# Patient Record
Sex: Female | Born: 1980 | Race: Black or African American | Hispanic: No | Marital: Married | Smoking: Never smoker
Health system: Southern US, Community
[De-identification: ages and names within clinical notes are randomized; demographics above are authoritative.]

## PROBLEM LIST (undated history)

## (undated) ENCOUNTER — Inpatient Hospital Stay (HOSPITAL_COMMUNITY): Payer: Self-pay

## (undated) DIAGNOSIS — Z9884 Bariatric surgery status: Secondary | ICD-10-CM

## (undated) DIAGNOSIS — E559 Vitamin D deficiency, unspecified: Secondary | ICD-10-CM

## (undated) DIAGNOSIS — I10 Essential (primary) hypertension: Secondary | ICD-10-CM

## (undated) DIAGNOSIS — E079 Disorder of thyroid, unspecified: Secondary | ICD-10-CM

## (undated) DIAGNOSIS — M25569 Pain in unspecified knee: Secondary | ICD-10-CM

## (undated) DIAGNOSIS — N946 Dysmenorrhea, unspecified: Secondary | ICD-10-CM

## (undated) DIAGNOSIS — E041 Nontoxic single thyroid nodule: Secondary | ICD-10-CM

## (undated) DIAGNOSIS — O09529 Supervision of elderly multigravida, unspecified trimester: Secondary | ICD-10-CM

## (undated) DIAGNOSIS — E669 Obesity, unspecified: Secondary | ICD-10-CM

## (undated) DIAGNOSIS — Z98891 History of uterine scar from previous surgery: Secondary | ICD-10-CM

## (undated) HISTORY — DX: Obesity, unspecified: E66.9

## (undated) HISTORY — DX: Supervision of elderly multigravida, unspecified trimester: O09.529

## (undated) HISTORY — DX: Pain in unspecified knee: M25.569

## (undated) HISTORY — DX: Vitamin D deficiency, unspecified: E55.9

## (undated) HISTORY — PX: LAPAROSCOPIC GASTRECTOMY: SHX5894

## (undated) HISTORY — DX: Bariatric surgery status: Z98.84

## (undated) HISTORY — DX: Nontoxic single thyroid nodule: E04.1

## (undated) HISTORY — DX: Dysmenorrhea, unspecified: N94.6

---

## 2006-06-06 ENCOUNTER — Encounter: Admission: RE | Admit: 2006-06-06 | Discharge: 2006-07-11 | Payer: Self-pay | Admitting: Sports Medicine

## 2007-04-24 ENCOUNTER — Emergency Department (HOSPITAL_COMMUNITY): Admission: EM | Admit: 2007-04-24 | Discharge: 2007-04-24 | Payer: Self-pay | Admitting: Family Medicine

## 2007-07-27 ENCOUNTER — Emergency Department (HOSPITAL_COMMUNITY): Admission: EM | Admit: 2007-07-27 | Discharge: 2007-07-27 | Payer: Self-pay | Admitting: Family Medicine

## 2008-11-07 ENCOUNTER — Emergency Department: Payer: Self-pay | Admitting: Unknown Physician Specialty

## 2010-09-12 ENCOUNTER — Emergency Department: Payer: Self-pay | Admitting: Emergency Medicine

## 2011-05-25 ENCOUNTER — Emergency Department: Payer: Self-pay | Admitting: Emergency Medicine

## 2011-08-31 LAB — STREP A DNA PROBE: Group A Strep Probe: NEGATIVE

## 2012-10-11 ENCOUNTER — Emergency Department: Payer: Self-pay | Admitting: Emergency Medicine

## 2012-11-05 LAB — HEMOGLOBIN A1C: Hgb A1c MFr Bld: 5.7 % (ref 4.0–6.0)

## 2013-01-30 ENCOUNTER — Ambulatory Visit: Payer: Self-pay | Admitting: Internal Medicine

## 2013-08-18 ENCOUNTER — Ambulatory Visit: Payer: Self-pay | Admitting: Family Medicine

## 2013-08-24 ENCOUNTER — Emergency Department (HOSPITAL_COMMUNITY): Payer: Self-pay

## 2013-08-24 ENCOUNTER — Emergency Department (HOSPITAL_COMMUNITY)
Admission: EM | Admit: 2013-08-24 | Discharge: 2013-08-24 | Disposition: A | Discharge status: Home / Self Care | Payer: Self-pay | Attending: Emergency Medicine | Admitting: Emergency Medicine

## 2013-08-24 ENCOUNTER — Encounter (HOSPITAL_COMMUNITY): Payer: Self-pay | Admitting: *Deleted

## 2013-08-24 DIAGNOSIS — E860 Dehydration: Secondary | ICD-10-CM | POA: Insufficient documentation

## 2013-08-24 DIAGNOSIS — R61 Generalized hyperhidrosis: Secondary | ICD-10-CM | POA: Insufficient documentation

## 2013-08-24 DIAGNOSIS — R42 Dizziness and giddiness: Secondary | ICD-10-CM | POA: Insufficient documentation

## 2013-08-24 DIAGNOSIS — I10 Essential (primary) hypertension: Secondary | ICD-10-CM | POA: Insufficient documentation

## 2013-08-24 DIAGNOSIS — Z79899 Other long term (current) drug therapy: Secondary | ICD-10-CM | POA: Insufficient documentation

## 2013-08-24 DIAGNOSIS — E079 Disorder of thyroid, unspecified: Secondary | ICD-10-CM | POA: Insufficient documentation

## 2013-08-24 DIAGNOSIS — R55 Syncope and collapse: Secondary | ICD-10-CM | POA: Insufficient documentation

## 2013-08-24 DIAGNOSIS — E876 Hypokalemia: Secondary | ICD-10-CM | POA: Insufficient documentation

## 2013-08-24 HISTORY — DX: Disorder of thyroid, unspecified: E07.9

## 2013-08-24 HISTORY — DX: Essential (primary) hypertension: I10

## 2013-08-24 LAB — CBC WITH DIFFERENTIAL/PLATELET
Basophils Absolute: 0 10*3/uL (ref 0.0–0.1)
Eosinophils Relative: 3 % (ref 0–5)
HCT: 36.9 % (ref 36.0–46.0)
Hemoglobin: 12 g/dL (ref 12.0–15.0)
Lymphocytes Relative: 28 % (ref 12–46)
Lymphs Abs: 1.8 10*3/uL (ref 0.7–4.0)
MCV: 85.6 fL (ref 78.0–100.0)
Monocytes Relative: 8 % (ref 3–12)
Neutro Abs: 3.9 10*3/uL (ref 1.7–7.7)
Platelets: 243 10*3/uL (ref 150–400)
RBC: 4.31 MIL/uL (ref 3.87–5.11)
RDW: 13.4 % (ref 11.5–15.5)
WBC: 6.4 10*3/uL (ref 4.0–10.5)

## 2013-08-24 LAB — URINALYSIS, ROUTINE W REFLEX MICROSCOPIC
Bilirubin Urine: NEGATIVE
Glucose, UA: NEGATIVE mg/dL
Hgb urine dipstick: NEGATIVE
Ketones, ur: NEGATIVE mg/dL
Leukocytes, UA: NEGATIVE
Nitrite: NEGATIVE
Specific Gravity, Urine: 1.011 (ref 1.005–1.030)
pH: 7 (ref 5.0–8.0)

## 2013-08-24 LAB — BASIC METABOLIC PANEL
CO2: 30 mEq/L (ref 19–32)
Chloride: 100 mEq/L (ref 96–112)
Creatinine, Ser: 1.23 mg/dL — ABNORMAL HIGH (ref 0.50–1.10)
GFR calc Af Amer: 66 mL/min — ABNORMAL LOW (ref >90)
Potassium: 2.7 mEq/L — CL (ref 3.5–5.1)
Sodium: 141 mEq/L (ref 135–145)

## 2013-08-24 LAB — POCT I-STAT, CHEM 8
BUN: 18 mg/dL (ref 6–23)
Chloride: 96 mEq/L (ref 96–112)
Creatinine, Ser: 1.6 mg/dL — ABNORMAL HIGH (ref 0.50–1.10)
HCT: 39 % (ref 36.0–46.0)
Potassium: 2.7 mEq/L — CL (ref 3.5–5.1)
Sodium: 140 mEq/L (ref 135–145)
TCO2: 28 mmol/L (ref 0–100)

## 2013-08-24 MED ORDER — SODIUM CHLORIDE 0.9 % IV BOLUS (SEPSIS)
1000.0000 mL | Freq: Once | INTRAVENOUS | Status: AC
  Administered 2013-08-24: 1000 mL via INTRAVENOUS

## 2013-08-24 MED ORDER — POTASSIUM CHLORIDE 10 MEQ/100ML IV SOLN
10.0000 meq | Freq: Once | INTRAVENOUS | Status: AC
  Administered 2013-08-24: 10 meq via INTRAVENOUS
  Filled 2013-08-24: qty 100

## 2013-08-24 MED ORDER — POTASSIUM CHLORIDE CRYS ER 20 MEQ PO TBCR
60.0000 meq | EXTENDED_RELEASE_TABLET | Freq: Once | ORAL | Status: AC
  Administered 2013-08-24: 60 meq via ORAL
  Filled 2013-08-24: qty 3

## 2013-08-24 NOTE — ED Notes (Signed)
Pt states she has had a recent change in BP medication.  Pt states she started to get a little dizzy and "went to sleep for a second."  No seizure activity per EMS

## 2013-08-24 NOTE — ED Provider Notes (Signed)
CSN: [id number]     Arrival date & time 08/24/13  2014 History   First MD Initiated Contact with Patient 08/24/13 2015     Chief Complaint  Patient presents with  . Loss of Consciousness   (Consider location/radiation/quality/duration/timing/severity/associated sxs/prior Treatment) Patient is a 32 y.o. female presenting with syncope. The history is provided by the patient.  Loss of Consciousness Episode history:  Single Most recent episode:  Today Timing:  Rare Progression:  Resolved Chronicity:  New Context: dehydration and standing up   Witnessed: yes   Relieved by:  Sitting up Worsened by:  Posture Ineffective treatments:  None tried Associated symptoms: diaphoresis and dizziness (light-headed)   Associated symptoms: no anxiety, no chest pain, no fever, no focal weakness, no headaches, no nausea, no palpitations, no seizures, no shortness of breath, no visual change, no vomiting and no weakness   Risk factors: no vascular disease     Past Medical History  Diagnosis Date  . Hypertension   . Thyroid disease    History reviewed. No pertinent past surgical history. No family history on file. History  Substance Use Topics  . Smoking status: Never Smoker   . Smokeless tobacco: Not on file  . Alcohol Use: No   OB History   Grav Para Term Preterm Abortions TAB SAB Ect Mult Living                 Review of Systems  Constitutional: Positive for diaphoresis. Negative for fever and activity change.  HENT: Negative for congestion and rhinorrhea.   Respiratory: Negative for chest tightness and shortness of breath.   Cardiovascular: Positive for syncope. Negative for chest pain and palpitations.  Gastrointestinal: Negative for nausea, vomiting, diarrhea and constipation.  Genitourinary: Negative for dysuria and difficulty urinating.  Musculoskeletal: Negative for myalgias and arthralgias.  Skin: Negative for color change, pallor, rash and wound.  Neurological: Positive for  dizziness (light-headed) and syncope. Negative for tremors, focal weakness, seizures, speech difficulty, weakness, light-headedness and headaches.  All other systems reviewed and are negative.    Allergies  Review of patient's allergies indicates no known allergies.  Home Medications   Current Outpatient Rx  Name  Route  Sig  Dispense  Refill  . amLODipine (NORVASC) 10 MG tablet   Oral   Take 10 mg by mouth daily.         . chlorthalidone (HYGROTON) 50 MG tablet   Oral   Take 50 mg by mouth daily.          BP 118/68  Pulse 67  Temp(Src) 97.6 F (36.4 C) (Oral)  Resp 20  SpO2 100%  LMP 08/12/2013 Physical Exam  Nursing note and vitals reviewed. Constitutional: She is oriented to person, place, and time. She appears well-developed and well-nourished. No distress.  Pleasant, Well-appearing female in no apparent distress  HENT:  Head: Normocephalic and atraumatic.  Mouth/Throat: Oropharynx is clear and moist. No oropharyngeal exudate.  Dry mucous membranes  Eyes: Conjunctivae and EOM are normal. Pupils are equal, round, and reactive to light.  Neck: Normal range of motion. Neck supple.  Cardiovascular: Normal rate, regular rhythm and normal heart sounds.  Exam reveals no gallop and no friction rub.   No murmur heard. Pulmonary/Chest: Effort normal and breath sounds normal. No respiratory distress. She has no wheezes. She has no rales. She exhibits no tenderness.  Abdominal: Soft. She exhibits no distension. There is no tenderness.  Musculoskeletal: Normal range of motion. She exhibits no edema and no  tenderness.  Lymphadenopathy:    She has no cervical adenopathy.  Neurological: She is alert and oriented to person, place, and time. She has normal strength. No cranial nerve deficit or sensory deficit. Coordination and gait normal. GCS eye subscore is 4. GCS verbal subscore is 5. GCS motor subscore is 6.  Reflex Scores:      Bicep reflexes are 2+ on the right side and 2+  on the left side.      Patellar reflexes are 2+ on the right side and 2+ on the left side. Skin: Skin is warm and dry. No rash noted. She is not diaphoretic.  Psychiatric: She has a normal mood and affect. Her behavior is normal. Judgment and thought content normal.    ED Course  Procedures (including critical care time) Labs Review Labs Reviewed  BASIC METABOLIC PANEL - Abnormal; Notable for the following:    Potassium 2.7 (*)    Glucose, Bld 107 (*)    Creatinine, Ser 1.23 (*)    GFR calc non Af Amer 57 (*)    GFR calc Af Amer 66 (*)    All other components within normal limits  POCT I-STAT, CHEM 8 - Abnormal; Notable for the following:    Potassium 2.7 (*)    Creatinine, Ser 1.60 (*)    Glucose, Bld 111 (*)    All other components within normal limits  CBC WITH DIFFERENTIAL  URINALYSIS, ROUTINE W REFLEX MICROSCOPIC   Imaging Review Dg Chest 2 View  08/24/2013   CLINICAL DATA:  Syncope today, recently started new blood pressure medication  EXAM: CHEST  2 VIEW  COMPARISON:  None.  FINDINGS: The heart size and mediastinal contours are within normal limits. Both lungs are clear. The visualized skeletal structures are unremarkable.  IMPRESSION: No active cardiopulmonary disease.   Electronically Signed   By: Esperanza Heir M.D.   On: 08/24/2013 21:16    MDM   1. Syncope   2. Hypokalemia      Date: 08/24/2013  Rate: 67  Rhythm: normal sinus rhythm  QRS Axis: normal  Intervals: normal  ST/T Wave abnormalities: normal  Conduction Disutrbances:none  Narrative Interpretation:   Old EKG Reviewed: none available  The patient is a 32 year old female with a history of hypertension as well as hyperthyroidism who presents with syncope. She was recently started on Norvasc by her PCP about one week ago. She states that the last 3-4 days she has had lightheadedness, mostly in the afternoons, which resolved with sitting. This evening, while ambulating, she began to feel lightheaded and  dizzy as well as diaphoretic. She tried to close her eyes maybe go away, and she proceeded to syncopize. Did not hit her head on the way down. Did not seize and has been well since.  On arrival, afebrile and hemodynamically stable. EKG shows no signs of arrythmia. CXR shows no consolidation, effusion, pneumothorax, or widened mediastinum. No leukocytosis or anemia. BMP shows hypokalemia at 2.7 but no EKG changes. Likely due to recent juicing. Feel syncope is most likely not related to hypokalemia and instead related to blood pressure medication adjustments and orthostasis. After hydration, no longer light headed and able to ambulate independently. Potassium replaced with 10 mE IV and 60 mE PO. Will have patient follow up with PCP in two days for repeat potassium check. Stable for d/c.  Discussed with the patient return precautions and need for follow up with PCP. Patient voiced understanding. Stable for d/c. This patient was discussed with my  attending, Dr. Jeraldine Loots.  Dorna Leitz, MD 08/25/13 [phone]

## 2013-08-25 NOTE — ED Provider Notes (Signed)
This patient was seen in conjunction with the Resident Physician, Dr. Durward Fortes.  I have seen the relevant studies (and ECG as performed) and agree with the interpretation.  The documentation is accurate, and reflects my interpretation of the encounter.    Gerhard Munch, MD 08/25/13 [phone]

## 2013-10-30 LAB — HM PAP SMEAR: HM Pap smear: NORMAL

## 2014-03-14 ENCOUNTER — Emergency Department: Payer: Self-pay | Admitting: Emergency Medicine

## 2014-03-16 ENCOUNTER — Emergency Department: Payer: Self-pay | Admitting: Emergency Medicine

## 2014-03-16 LAB — CBC WITH DIFFERENTIAL/PLATELET
BASOS ABS: 0.1 10*3/uL (ref 0.0–0.1)
Basophil %: 1.4 %
EOS PCT: 5.2 %
Eosinophil #: 0.2 10*3/uL (ref 0.0–0.7)
HCT: 41 % (ref 35.0–47.0)
HGB: 13.3 g/dL (ref 12.0–16.0)
Lymphocyte #: 1.2 10*3/uL (ref 1.0–3.6)
Lymphocyte %: 31.6 %
MCH: 28.7 pg (ref 26.0–34.0)
MCHC: 32.4 g/dL (ref 32.0–36.0)
MCV: 89 fL (ref 80–100)
MONO ABS: 0.3 x10 3/mm (ref 0.2–0.9)
Monocyte %: 8.1 %
Neutrophil #: 2 10*3/uL (ref 1.4–6.5)
Neutrophil %: 53.7 %
PLATELETS: 257 10*3/uL (ref 150–440)
RBC: 4.62 10*6/uL (ref 3.80–5.20)
RDW: 14.1 % (ref 11.5–14.5)
WBC: 3.8 10*3/uL (ref 3.6–11.0)

## 2014-03-16 LAB — COMPREHENSIVE METABOLIC PANEL
ALK PHOS: 60 U/L
Albumin: 3.9 g/dL (ref 3.4–5.0)
Anion Gap: 6 — ABNORMAL LOW (ref 7–16)
BUN: 11 mg/dL (ref 7–18)
Bilirubin,Total: 0.6 mg/dL (ref 0.2–1.0)
Calcium, Total: 9.2 mg/dL (ref 8.5–10.1)
Chloride: 103 mmol/L (ref 98–107)
Co2: 31 mmol/L (ref 21–32)
Creatinine: 1.07 mg/dL (ref 0.60–1.30)
EGFR (African American): >60
EGFR (Non-African Amer.): >60
Glucose: 73 mg/dL (ref 65–99)
OSMOLALITY: 277 (ref 275–301)
Potassium: 3.7 mmol/L (ref 3.5–5.1)
SGOT(AST): 12 U/L — ABNORMAL LOW (ref 15–37)
SGPT (ALT): 15 U/L (ref 12–78)
Sodium: 140 mmol/L (ref 136–145)
Total Protein: 7.5 g/dL (ref 6.4–8.2)

## 2014-03-16 LAB — LIPASE, BLOOD: Lipase: 162 U/L (ref 73–393)

## 2014-06-23 ENCOUNTER — Emergency Department: Payer: Self-pay | Admitting: Emergency Medicine

## 2014-06-23 LAB — CBC WITH DIFFERENTIAL/PLATELET
Basophil #: 0.1 10*3/uL (ref 0.0–0.1)
Basophil %: 1.4 %
Eosinophil #: 0.3 10*3/uL (ref 0.0–0.7)
Eosinophil %: 5.3 %
HCT: 38.2 % (ref 35.0–47.0)
HGB: 12.6 g/dL (ref 12.0–16.0)
LYMPHS ABS: 1.6 10*3/uL (ref 1.0–3.6)
LYMPHS PCT: 34.3 %
MCH: 29.1 pg (ref 26.0–34.0)
MCHC: 32.9 g/dL (ref 32.0–36.0)
MCV: 89 fL (ref 80–100)
MONOS PCT: 8.6 %
Monocyte #: 0.4 x10 3/mm (ref 0.2–0.9)
Neutrophil #: 2.4 10*3/uL (ref 1.4–6.5)
Neutrophil %: 50.4 %
PLATELETS: 220 10*3/uL (ref 150–440)
RBC: 4.32 10*6/uL (ref 3.80–5.20)
RDW: 13.5 % (ref 11.5–14.5)
WBC: 4.7 10*3/uL (ref 3.6–11.0)

## 2014-06-23 LAB — BASIC METABOLIC PANEL
ANION GAP: 6 — AB (ref 7–16)
BUN: 11 mg/dL (ref 7–18)
CO2: 29 mmol/L (ref 21–32)
Calcium, Total: 8.5 mg/dL (ref 8.5–10.1)
Chloride: 104 mmol/L (ref 98–107)
Creatinine: 1.24 mg/dL (ref 0.60–1.30)
EGFR (African American): >60
GFR CALC NON AF AMER: 57 — AB
Glucose: 72 mg/dL (ref 65–99)
Osmolality: 275 (ref 275–301)
POTASSIUM: 3.7 mmol/L (ref 3.5–5.1)
Sodium: 139 mmol/L (ref 136–145)

## 2014-06-23 LAB — URINALYSIS, COMPLETE
BILIRUBIN, UR: NEGATIVE
Bacteria: NONE SEEN
Blood: NEGATIVE
GLUCOSE, UR: NEGATIVE mg/dL (ref 0–75)
Ketone: NEGATIVE
Leukocyte Esterase: NEGATIVE
Nitrite: NEGATIVE
Ph: 5 (ref 4.5–8.0)
Protein: 30
RBC,UR: 7 /HPF (ref 0–5)
Specific Gravity: 1.031 (ref 1.003–1.030)
Squamous Epithelial: 3
WBC UR: 1 /HPF (ref 0–5)

## 2014-06-23 LAB — TROPONIN I: Troponin-I: <0.02 ng/mL

## 2014-08-05 ENCOUNTER — Emergency Department: Payer: Self-pay | Admitting: Student

## 2014-08-05 LAB — URINALYSIS, COMPLETE
Bacteria: NONE SEEN
Bilirubin,UR: NEGATIVE
Blood: NEGATIVE
Glucose,UR: NEGATIVE mg/dL (ref 0–75)
Ketone: NEGATIVE
Leukocyte Esterase: NEGATIVE
Nitrite: NEGATIVE
Ph: 6 (ref 4.5–8.0)
Protein: NEGATIVE
RBC,UR: 1 /HPF (ref 0–5)
SPECIFIC GRAVITY: 1.018 (ref 1.003–1.030)
Squamous Epithelial: <1
WBC UR: <1 /HPF (ref 0–5)

## 2014-08-05 LAB — CBC
HCT: 37.3 % (ref 35.0–47.0)
HGB: 11.7 g/dL — ABNORMAL LOW (ref 12.0–16.0)
MCH: 28.3 pg (ref 26.0–34.0)
MCHC: 31.4 g/dL — ABNORMAL LOW (ref 32.0–36.0)
MCV: 90 fL (ref 80–100)
PLATELETS: 225 10*3/uL (ref 150–440)
RBC: 4.13 10*6/uL (ref 3.80–5.20)
RDW: 14.6 % — ABNORMAL HIGH (ref 11.5–14.5)
WBC: 4.1 10*3/uL (ref 3.6–11.0)

## 2014-08-05 LAB — BASIC METABOLIC PANEL
ANION GAP: 7 (ref 7–16)
BUN: 12 mg/dL (ref 7–18)
CO2: 25 mmol/L (ref 21–32)
CREATININE: 1.05 mg/dL (ref 0.60–1.30)
Calcium, Total: 8.1 mg/dL — ABNORMAL LOW (ref 8.5–10.1)
Chloride: 108 mmol/L — ABNORMAL HIGH (ref 98–107)
EGFR (African American): >60
EGFR (Non-African Amer.): >60
Glucose: 72 mg/dL (ref 65–99)
Osmolality: 278 (ref 275–301)
POTASSIUM: 4.1 mmol/L (ref 3.5–5.1)
Sodium: 140 mmol/L (ref 136–145)

## 2014-08-05 LAB — TROPONIN I: Troponin-I: <0.02 ng/mL

## 2014-08-17 LAB — LIPID PANEL
CHOLESTEROL: 152 mg/dL (ref 0–200)
HDL: 67 mg/dL (ref 35–70)
LDL CALC: 74 mg/dL
TRIGLYCERIDES: 55 mg/dL (ref 40–160)

## 2014-10-23 ENCOUNTER — Emergency Department: Payer: Self-pay | Admitting: Student

## 2014-10-23 LAB — CBC WITH DIFFERENTIAL/PLATELET
BASOS ABS: 0.1 10*3/uL (ref 0.0–0.1)
Basophil %: 1 %
Eosinophil #: 0.1 10*3/uL (ref 0.0–0.7)
Eosinophil %: 2.5 %
HCT: 37.8 % (ref 35.0–47.0)
HGB: 12 g/dL (ref 12.0–16.0)
LYMPHS ABS: 1.3 10*3/uL (ref 1.0–3.6)
Lymphocyte %: 23.6 %
MCH: 29 pg (ref 26.0–34.0)
MCHC: 31.9 g/dL — ABNORMAL LOW (ref 32.0–36.0)
MCV: 91 fL (ref 80–100)
MONOS PCT: 8.4 %
Monocyte #: 0.5 x10 3/mm (ref 0.2–0.9)
Neutrophil #: 3.7 10*3/uL (ref 1.4–6.5)
Neutrophil %: 64.5 %
Platelet: 226 10*3/uL (ref 150–440)
RBC: 4.14 10*6/uL (ref 3.80–5.20)
RDW: 13.1 % (ref 11.5–14.5)
WBC: 5.7 10*3/uL (ref 3.6–11.0)

## 2014-10-23 LAB — URINALYSIS, COMPLETE
Bacteria: NONE SEEN
Bilirubin,UR: NEGATIVE
Blood: NEGATIVE
GLUCOSE, UR: NEGATIVE mg/dL (ref 0–75)
Ketone: NEGATIVE
LEUKOCYTE ESTERASE: NEGATIVE
NITRITE: NEGATIVE
PROTEIN: NEGATIVE
Ph: 6 (ref 4.5–8.0)
SPECIFIC GRAVITY: 1.017 (ref 1.003–1.030)
Squamous Epithelial: 6
WBC UR: 1 /HPF (ref 0–5)

## 2014-10-23 LAB — COMPREHENSIVE METABOLIC PANEL
ALBUMIN: 3.6 g/dL (ref 3.4–5.0)
ALK PHOS: 58 U/L
Anion Gap: 6 — ABNORMAL LOW (ref 7–16)
BILIRUBIN TOTAL: 0.5 mg/dL (ref 0.2–1.0)
BUN: 10 mg/dL (ref 7–18)
CO2: 28 mmol/L (ref 21–32)
Calcium, Total: 8.5 mg/dL (ref 8.5–10.1)
Chloride: 104 mmol/L (ref 98–107)
Creatinine: 0.91 mg/dL (ref 0.60–1.30)
EGFR (African American): >60
EGFR (Non-African Amer.): >60
GLUCOSE: 85 mg/dL (ref 65–99)
OSMOLALITY: 274 (ref 275–301)
POTASSIUM: 3.7 mmol/L (ref 3.5–5.1)
SGOT(AST): 20 U/L (ref 15–37)
SGPT (ALT): 16 U/L
SODIUM: 138 mmol/L (ref 136–145)
TOTAL PROTEIN: 6.9 g/dL (ref 6.4–8.2)

## 2014-10-23 LAB — HCG, QUANTITATIVE, PREGNANCY: Beta Hcg, Quant.: 23401 m[IU]/mL — ABNORMAL HIGH

## 2014-11-22 ENCOUNTER — Emergency Department: Payer: Self-pay | Admitting: Emergency Medicine

## 2014-11-22 LAB — URINALYSIS, COMPLETE
BACTERIA: NONE SEEN
BILIRUBIN, UR: NEGATIVE
Blood: NEGATIVE
GLUCOSE, UR: NEGATIVE mg/dL (ref 0–75)
Nitrite: NEGATIVE
Ph: 6 (ref 4.5–8.0)
Protein: NEGATIVE
RBC,UR: 3 /HPF (ref 0–5)
SPECIFIC GRAVITY: 1.011 (ref 1.003–1.030)

## 2015-01-07 LAB — OB RESULTS CONSOLE HEPATITIS B SURFACE ANTIGEN: Hepatitis B Surface Ag: NEGATIVE

## 2015-01-07 LAB — OB RESULTS CONSOLE RUBELLA ANTIBODY, IGM: Rubella: NON-IMMUNE/NOT IMMUNE

## 2015-01-07 LAB — OB RESULTS CONSOLE ABO/RH: RH Type: POSITIVE

## 2015-01-07 LAB — OB RESULTS CONSOLE RPR: RPR: NONREACTIVE

## 2015-01-07 LAB — OB RESULTS CONSOLE GC/CHLAMYDIA
CHLAMYDIA, DNA PROBE: NEGATIVE
GC PROBE AMP, GENITAL: NEGATIVE

## 2015-01-07 LAB — OB RESULTS CONSOLE ANTIBODY SCREEN: Antibody Screen: NEGATIVE

## 2015-01-07 LAB — OB RESULTS CONSOLE HIV ANTIBODY (ROUTINE TESTING): HIV: NONREACTIVE

## 2015-05-17 ENCOUNTER — Inpatient Hospital Stay (HOSPITAL_COMMUNITY)
Admission: AD | Admit: 2015-05-17 | Discharge: 2015-05-17 | Disposition: A | Discharge status: Home / Self Care | Payer: Medicaid Other | Source: Ambulatory Visit | Attending: Obstetrics and Gynecology | Admitting: Obstetrics and Gynecology

## 2015-05-17 ENCOUNTER — Encounter (HOSPITAL_COMMUNITY): Payer: Self-pay

## 2015-05-17 DIAGNOSIS — Z3A36 36 weeks gestation of pregnancy: Secondary | ICD-10-CM | POA: Diagnosis not present

## 2015-05-17 DIAGNOSIS — O10913 Unspecified pre-existing hypertension complicating pregnancy, third trimester: Secondary | ICD-10-CM | POA: Insufficient documentation

## 2015-05-17 LAB — URINALYSIS, ROUTINE W REFLEX MICROSCOPIC
Bilirubin Urine: NEGATIVE
GLUCOSE, UA: NEGATIVE mg/dL
Ketones, ur: 15 mg/dL — AB
Leukocytes, UA: NEGATIVE
NITRITE: NEGATIVE
PH: 6 (ref 5.0–8.0)
Protein, ur: NEGATIVE mg/dL
SPECIFIC GRAVITY, URINE: 1.025 (ref 1.005–1.030)
Urobilinogen, UA: 0.2 mg/dL (ref 0.0–1.0)

## 2015-05-17 LAB — URINE MICROSCOPIC-ADD ON

## 2015-05-17 LAB — COMPREHENSIVE METABOLIC PANEL
ALT: 15 U/L (ref 14–54)
AST: 16 U/L (ref 15–41)
Albumin: 3.2 g/dL — ABNORMAL LOW (ref 3.5–5.0)
Alkaline Phosphatase: 125 U/L (ref 38–126)
Anion gap: 6 (ref 5–15)
BILIRUBIN TOTAL: 0.5 mg/dL (ref 0.3–1.2)
BUN: 6 mg/dL (ref 6–20)
CHLORIDE: 106 mmol/L (ref 101–111)
CO2: 23 mmol/L (ref 22–32)
CREATININE: 0.76 mg/dL (ref 0.44–1.00)
Calcium: 8.6 mg/dL — ABNORMAL LOW (ref 8.9–10.3)
GFR calc Af Amer: >60 mL/min (ref >60)
GFR calc non Af Amer: >60 mL/min (ref >60)
Glucose, Bld: 79 mg/dL (ref 65–99)
POTASSIUM: 3.8 mmol/L (ref 3.5–5.1)
Sodium: 135 mmol/L (ref 135–145)
Total Protein: 6.6 g/dL (ref 6.5–8.1)

## 2015-05-17 LAB — CBC
HEMATOCRIT: 31.6 % — AB (ref 36.0–46.0)
HEMOGLOBIN: 10.7 g/dL — AB (ref 12.0–15.0)
MCH: 30.5 pg (ref 26.0–34.0)
MCHC: 33.9 g/dL (ref 30.0–36.0)
MCV: 90 fL (ref 78.0–100.0)
Platelets: 235 10*3/uL (ref 150–400)
RBC: 3.51 MIL/uL — AB (ref 3.87–5.11)
RDW: 13 % (ref 11.5–15.5)
WBC: 5.8 10*3/uL (ref 4.0–10.5)

## 2015-05-17 LAB — PROTEIN / CREATININE RATIO, URINE
Creatinine, Urine: 177 mg/dL
Protein Creatinine Ratio: 0.09 mg/mg{Cre} (ref 0.00–0.15)
Total Protein, Urine: 16 mg/dL

## 2015-05-17 NOTE — MAU Provider Note (Signed)
Chief Complaint:  No chief complaint on file.   First Provider Initiated Contact with Patient 05/17/15 1946      HPI: Michelle Holder is a 34 y.o. [id number] at [redacted]w[redacted]d who was sent to maternity admissions for preeclampsia workup. BP 150/108 in the office.  Denies HA, vision changes or epigastric pain. Hx Pre-E w/ G3 resulting in failed IOL and C/S. In antenatal for [facility]. No Meds now. Had been on Aldomet earlier in the pregnancy.    Past Medical History: Past Medical History  Diagnosis Date  . Hypertension   . Thyroid disease     Past obstetric history: OB History  Gravida Para Term Preterm AB SAB TAB Ectopic Multiple Living  4         3    # Outcome Date GA Lbr Len/2nd Weight Sex Delivery Anes PTL Lv  4 Current           3 Gravida      CS-Unspec   Y  2 Gravida      Vag-Spont   Y  1 Gravida      Vag-Spont   Y      Past Surgical History: Past Surgical History  Procedure Laterality Date  . Cesarean section       Family History: History reviewed. No pertinent family history.  Social History: History  Substance Use Topics  . Smoking status: Never Smoker   . Smokeless tobacco: Never Used  . Alcohol Use: No    Allergies: No Known Allergies  Meds:  No prescriptions prior to admission    ROS:  Review of Systems  Constitutional: Negative for fever and chills.  Eyes: Negative for blurred vision.  Gastrointestinal: Negative for abdominal pain.  Genitourinary:       Neg for contractions, VB, LOF.   Neurological: Negative for headaches.    Physical Exam  Blood pressure 150/94, pulse 74, temperature 98 F (36.7 C), temperature source Oral, resp. rate 18, height 5\' 8"  (1.727 m), weight 247 lb (112.038 kg).  Patient Vitals for the past 24 hrs:  BP Temp Temp src Pulse Resp Height Weight  05/17/15 2047 150/94 mmHg - - 74 - - -  05/17/15 2045 150/94 mmHg 98 F (36.7 C) - 74 18 - -  05/17/15 2032 135/87 mmHg - - 74 - - -  05/17/15 2017 136/89 mmHg - - 76 - - -   05/17/15 2015 134/96 mmHg - - 74 - - -  05/17/15 1928 132/95 mmHg 98.2 F (36.8 C) Oral 90 18 5\' 8"  (1.727 m) 247 lb (112.038 kg)  05/17/15 1921 (!) 154/103 mmHg - - 76 - - -    GENERAL: Well-developed, well-nourished female in no acute distress.  HEART: normal rate RESP: normal effort GI: Abd soft, non-tender, gravid appropriate for gestational age.  MS: Extremities nontender, 1+ pedal edema, normal ROM NEURO: Alert and oriented x 4.  GU: Deferred    FHT:  Baseline 135 , moderate variability, accelerations present, no decelerations Contractions: Rare, mild   Labs: Results for orders placed or performed during the hospital encounter of 05/17/15 (from the past 24 hour(s))  Urinalysis, Routine w reflex microscopic (not at [facility])     Status: Abnormal   Collection Time: 05/17/15  7:11 PM  Result Value Ref Range   Color, Urine YELLOW YELLOW   APPearance CLEAR CLEAR   Specific Gravity, Urine 1.025 1.005 - 1.030   pH 6.0 5.0 - 8.0   Glucose, UA NEGATIVE NEGATIVE mg/dL  Hgb urine dipstick SMALL (A) NEGATIVE   Bilirubin Urine NEGATIVE NEGATIVE   Ketones, ur 15 (A) NEGATIVE mg/dL   Protein, ur NEGATIVE NEGATIVE mg/dL   Urobilinogen, UA 0.2 0.0 - 1.0 mg/dL   Nitrite NEGATIVE NEGATIVE   Leukocytes, UA NEGATIVE NEGATIVE  Protein / creatinine ratio, urine     Status: None   Collection Time: 05/17/15  7:11 PM  Result Value Ref Range   Creatinine, Urine 177.00 mg/dL   Total Protein, Urine 16 mg/dL   Protein Creatinine Ratio 0.09 0.00 - 0.15 mg/mg[Cre]  Urine microscopic-add on     Status: Abnormal   Collection Time: 05/17/15  7:11 PM  Result Value Ref Range   Squamous Epithelial / LPF RARE RARE   WBC, UA 0-2 <3 WBC/hpf   RBC / HPF 3-6 <3 RBC/hpf   Bacteria, UA FEW (A) RARE  CBC     Status: Abnormal   Collection Time: 05/17/15  7:33 PM  Result Value Ref Range   WBC 5.8 4.0 - 10.5 K/uL   RBC 3.51 (L) 3.87 - 5.11 MIL/uL   Hemoglobin 10.7 (L) 12.0 - 15.0 g/dL   HCT 40.931.6 (L) 81.136.0 -  46.0 %   MCV 90.0 78.0 - 100.0 fL   MCH 30.5 26.0 - 34.0 pg   MCHC 33.9 30.0 - 36.0 g/dL   RDW 91.413.0 78.211.5 - 95.615.5 %   Platelets 235 150 - 400 K/uL  Comprehensive metabolic panel     Status: Abnormal   Collection Time: 05/17/15  7:33 PM  Result Value Ref Range   Sodium 135 135 - 145 mmol/L   Potassium 3.8 3.5 - 5.1 mmol/L   Chloride 106 101 - 111 mmol/L   CO2 23 22 - 32 mmol/L   Glucose, Bld 79 65 - 99 mg/dL   BUN 6 6 - 20 mg/dL   Creatinine, Ser 2.130.76 0.44 - 1.00 mg/dL   Calcium 8.6 (L) 8.9 - 10.3 mg/dL   Total Protein 6.6 6.5 - 8.1 g/dL   Albumin 3.2 (L) 3.5 - 5.0 g/dL   AST 16 15 - 41 U/L   ALT 15 14 - 54 U/L   Alkaline Phosphatase 125 38 - 126 U/L   Total Bilirubin 0.5 0.3 - 1.2 mg/dL   GFR calc non Af Amer >60 >60 mL/min   GFR calc Af Amer >60 >60 mL/min   Anion gap 6 5 - 15    Imaging:  No results found.  MAU Course: CBC, CMP, UA, protein creatinine ratio, cycle blood pressures.  Dr. Senaida Oresichardson notified of stable BP, normal labs, No pre-E Sx. No evidence of Pre-E today.   Assessment: 1. Preexisting hypertension complicating pregnancy in third trimester, antepartum     Plan: Discharge home in stable condition per consult w/ Dr. Senaida Oresichardson. Pre-E precautions.  Labor precautions and fetal kick counts     Follow-up Information    Follow up with Oliver PilaICHARDSON,KATHY W, MD In 3 days.   Specialty:  Obstetrics and Gynecology   Why:  Routine prenatal visit or sooner as needed if symptoms worsen   Contact information:   [address] [place] [address] [phone]       Follow [facility] with [facility].   Why:  As needed in emergencies   Contact information:   [address] [id number] mc [place] [id number] [phone]        Medication List    TAKE these medications  prenatal multivitamin Tabs tablet  Take 3 tablets by mouth daily at 12 noon.        [place],  [place] 05/17/2015 10:35 PM

## 2015-05-17 NOTE — Discharge Instructions (Signed)

## 2015-05-17 NOTE — MAU Note (Signed)
Had an NST visit and blood pressure was 150/108 so she was sent to the MAU. No preg issues no leaking/drainage

## 2015-05-29 ENCOUNTER — Encounter (HOSPITAL_COMMUNITY): Payer: Self-pay | Admitting: *Deleted

## 2015-05-29 ENCOUNTER — Inpatient Hospital Stay (HOSPITAL_COMMUNITY)
Admission: AD | Admit: 2015-05-29 | Discharge: 2015-05-29 | Disposition: A | Discharge status: Home / Self Care | Payer: Medicaid Other | Source: Ambulatory Visit | Attending: Obstetrics and Gynecology | Admitting: Obstetrics and Gynecology

## 2015-05-29 DIAGNOSIS — Z3493 Encounter for supervision of normal pregnancy, unspecified, third trimester: Secondary | ICD-10-CM | POA: Diagnosis present

## 2015-05-29 NOTE — MAU Note (Signed)
Urine in lab 

## 2015-05-29 NOTE — Discharge Instructions (Signed)
Braxton Hicks Contractions °Contractions of the uterus can occur throughout pregnancy. Contractions are not always a sign that you are in labor.  °WHAT ARE BRAXTON HICKS CONTRACTIONS?  °Contractions that occur before labor are called Braxton Hicks contractions, or false labor. Toward the end of pregnancy (32-34 weeks), these contractions can develop more often and may become more forceful. This is not true labor because these contractions do not result in opening (dilatation) and thinning of the cervix. They are sometimes difficult to tell apart from true labor because these contractions can be forceful and people have different pain tolerances. You should not feel embarrassed if you go to the hospital with false labor. Sometimes, the only way to tell if you are in true labor is for your health care provider to look for changes in the cervix. °If there are no prenatal problems or other health problems associated with the pregnancy, it is completely safe to be sent home with false labor and await the onset of true labor. °HOW CAN YOU TELL THE DIFFERENCE BETWEEN TRUE AND FALSE LABOR? °False Labor °· The contractions of false labor are usually shorter and not as hard as those of true labor.   °· The contractions are usually irregular.   °· The contractions are often felt in the front of the lower abdomen and in the groin.   °· The contractions may go away when you walk around or change positions while lying down.   °· The contractions get weaker and are shorter lasting as time goes on.   °· The contractions do not usually become progressively stronger, regular, and closer together as with true labor.   °True Labor °· Contractions in true labor last 30-70 seconds, become very regular, usually become more intense, and increase in frequency.   °· The contractions do not go away with walking.   °· The discomfort is usually felt in the top of the uterus and spreads to the lower abdomen and low back.   °· True labor can be  determined by your health care provider with an exam. This will show that the cervix is dilating and getting thinner.   °WHAT TO REMEMBER °· Keep up with your usual exercises and follow other instructions given by your health care provider.   °· Take medicines as directed by your health care provider.   °· Keep your regular prenatal appointments.   °· Eat and drink lightly if you think you are going into labor.   °· If Braxton Hicks contractions are making you uncomfortable:   °¨ Change your position from lying down or resting to walking, or from walking to resting.   °¨ Sit and rest in a tub of warm water.   °¨ Drink 2-3 glasses of water. Dehydration may cause these contractions.   °¨ Do slow and deep breathing several times an hour.   °WHEN SHOULD I SEEK IMMEDIATE MEDICAL CARE? °Seek immediate medical care if: °· Your contractions become stronger, more regular, and closer together.   °· You have fluid leaking or gushing from your vagina.   °· You have a fever.   °· You pass blood-tinged mucus.   °· You have vaginal bleeding.   °· You have continuous abdominal pain.   °· You have low back pain that you never had before.   °· You feel your baby's head pushing down and causing pelvic pressure.   °· Your baby is not moving as much as it used to.   °Document Released: 11/05/2005 Document Revised: 11/10/2013 Document Reviewed: 08/17/2013 °ExitCare® Patient Information ©2015 [employer]. This information is not intended to replace advice given to you by your health care   provider. Make sure you discuss any questions you have with your health care provider. ° °

## 2015-06-06 ENCOUNTER — Telehealth (HOSPITAL_COMMUNITY): Payer: Self-pay | Admitting: *Deleted

## 2015-06-06 ENCOUNTER — Encounter (HOSPITAL_COMMUNITY): Payer: Self-pay | Admitting: *Deleted

## 2015-06-06 LAB — OB RESULTS CONSOLE GBS: STREP GROUP B AG: POSITIVE

## 2015-06-06 NOTE — Telephone Encounter (Signed)
Preadmission screen  

## 2015-06-08 ENCOUNTER — Inpatient Hospital Stay (HOSPITAL_COMMUNITY)
Admission: RE | Admit: 2015-06-08 | Discharge: 2015-06-11 | DRG: 774 | Disposition: A | Discharge status: Home / Self Care | Payer: Medicaid Other | Source: Ambulatory Visit | Attending: Obstetrics and Gynecology | Admitting: Obstetrics and Gynecology

## 2015-06-08 ENCOUNTER — Encounter (HOSPITAL_COMMUNITY): Payer: Self-pay

## 2015-06-08 VITALS — BP 145/77 | HR 57 | Temp 98.4°F | Resp 18 | Ht 68.0 in | Wt 248.0 lb

## 2015-06-08 DIAGNOSIS — Z6837 Body mass index (BMI) 37.0-37.9, adult: Secondary | ICD-10-CM

## 2015-06-08 DIAGNOSIS — Z3A39 39 weeks gestation of pregnancy: Secondary | ICD-10-CM | POA: Diagnosis present

## 2015-06-08 DIAGNOSIS — O3421 Maternal care for scar from previous cesarean delivery: Secondary | ICD-10-CM | POA: Diagnosis present

## 2015-06-08 DIAGNOSIS — O99214 Obesity complicating childbirth: Secondary | ICD-10-CM | POA: Diagnosis present

## 2015-06-08 DIAGNOSIS — Z3490 Encounter for supervision of normal pregnancy, unspecified, unspecified trimester: Secondary | ICD-10-CM

## 2015-06-08 DIAGNOSIS — O10913 Unspecified pre-existing hypertension complicating pregnancy, third trimester: Secondary | ICD-10-CM

## 2015-06-08 DIAGNOSIS — O1092 Unspecified pre-existing hypertension complicating childbirth: Principal | ICD-10-CM | POA: Diagnosis present

## 2015-06-08 DIAGNOSIS — R030 Elevated blood-pressure reading, without diagnosis of hypertension: Secondary | ICD-10-CM | POA: Diagnosis present

## 2015-06-08 LAB — CBC
HCT: 32.1 % — ABNORMAL LOW (ref 36.0–46.0)
Hemoglobin: 10.5 g/dL — ABNORMAL LOW (ref 12.0–15.0)
MCH: 30 pg (ref 26.0–34.0)
MCHC: 32.7 g/dL (ref 30.0–36.0)
MCV: 91.7 fL (ref 78.0–100.0)
PLATELETS: 222 10*3/uL (ref 150–400)
RBC: 3.5 MIL/uL — ABNORMAL LOW (ref 3.87–5.11)
RDW: 13.7 % (ref 11.5–15.5)
WBC: 6.2 10*3/uL (ref 4.0–10.5)

## 2015-06-08 LAB — TYPE AND SCREEN
ABO/RH(D): AB POS
Antibody Screen: NEGATIVE

## 2015-06-08 LAB — RPR: RPR: NONREACTIVE

## 2015-06-08 LAB — ABO/RH: ABO/RH(D): AB POS

## 2015-06-08 MED ORDER — TERBUTALINE SULFATE 1 MG/ML IJ SOLN: 0.2500 mg | Freq: Once | INTRAMUSCULAR | Status: AC | PRN

## 2015-06-08 MED ORDER — LABETALOL HCL 5 MG/ML IV SOLN
INTRAVENOUS | Status: AC
  Filled 2015-06-08: qty 4

## 2015-06-08 MED ORDER — BUTORPHANOL TARTRATE 1 MG/ML IJ SOLN
1.0000 mg | Freq: Once | INTRAMUSCULAR | Status: AC
  Administered 2015-06-08: 1 mg via INTRAVENOUS
  Filled 2015-06-08: qty 1

## 2015-06-08 MED ORDER — LABETALOL HCL 5 MG/ML IV SOLN
20.0000 mg | INTRAVENOUS | Status: AC | PRN
  Administered 2015-06-08 – 2015-06-09 (×3): 20 mg via INTRAVENOUS
  Filled 2015-06-08 (×2): qty 4

## 2015-06-08 MED ORDER — LACTATED RINGERS IV SOLN: 500.0000 mL | INTRAVENOUS | Status: DC | PRN

## 2015-06-08 MED ORDER — OXYTOCIN 40 UNITS IN LACTATED RINGERS INFUSION - SIMPLE MED
1.0000 m[IU]/min | INTRAVENOUS | Status: DC
  Filled 2015-06-08: qty 1000

## 2015-06-08 MED ORDER — LACTATED RINGERS IV SOLN
INTRAVENOUS | Status: DC
  Administered 2015-06-08 (×2): via INTRAVENOUS

## 2015-06-08 MED ORDER — LIDOCAINE HCL (PF) 1 % IJ SOLN
30.0000 mL | INTRAMUSCULAR | Status: AC | PRN
  Administered 2015-06-09: 30 mL via SUBCUTANEOUS
  Filled 2015-06-08: qty 30

## 2015-06-08 MED ORDER — OXYTOCIN 40 UNITS IN LACTATED RINGERS INFUSION - SIMPLE MED: 62.5000 mL/h | INTRAVENOUS | Status: DC

## 2015-06-08 MED ORDER — OXYCODONE-ACETAMINOPHEN 5-325 MG PO TABS: 1.0000 | ORAL_TABLET | ORAL | Status: DC | PRN

## 2015-06-08 MED ORDER — OXYCODONE-ACETAMINOPHEN 5-325 MG PO TABS: 2.0000 | ORAL_TABLET | ORAL | Status: DC | PRN

## 2015-06-08 MED ORDER — DEXTROSE 5 % IV SOLN
2.5000 10*6.[IU] | INTRAVENOUS | Status: DC
  Administered 2015-06-08 – 2015-06-09 (×4): 2.5 10*6.[IU] via INTRAVENOUS
  Filled 2015-06-08 (×8): qty 2.5

## 2015-06-08 MED ORDER — DEXTROSE 5 % IV SOLN
5.0000 10*6.[IU] | Freq: Once | INTRAVENOUS | Status: AC
  Administered 2015-06-08: 5 10*6.[IU] via INTRAVENOUS
  Filled 2015-06-08: qty 5

## 2015-06-08 MED ORDER — OXYTOCIN BOLUS FROM INFUSION
500.0000 mL | INTRAVENOUS | Status: DC
  Administered 2015-06-09: 500 mL via INTRAVENOUS

## 2015-06-08 NOTE — Progress Notes (Signed)
Patient ID: Michelle PlumeLatoya M Castro Holder, female   DOB: 08/01/1981, 34 y.o.   MRN: [id number] PT admitted for induction because of HBP. She is not having regular contractions. I have spoken with MFM and advice was to deliver this week and to try AROM and Foley. Cervix is 2 cm 50% effaeds and the vertex is at - 2 station. Foley inserted under direct vision and 60 cc's injected into the bulb

## 2015-06-08 NOTE — Progress Notes (Signed)
Patient ID: Michelle Holder, female   DOB: 08/31/1981, 34 y.o.   MRN: [id number] She has never developed a regular contraction pattern but the Foley bulb came out and the RN exam was 4 cm 50% effaced. I have advised her if she does not start labor I will begin pitocin.

## 2015-06-08 NOTE — Progress Notes (Signed)
Patient ID: Michelle Holder, female   DOB: 06/12/1981, 34 y.o.   MRN: [id number] Pitocin is at 3 mu/minute and her contractions do not print well but she thinks they are q 3 minutes. The cervix is 4+ cm 70% effaced and the vertex is at - 2 station.

## 2015-06-08 NOTE — Progress Notes (Signed)
Patient ID: Michelle Holder, female   DOB: 11/01/1981, 34 y.o.   MRN: [id number] The pt states her contractions are q 2 minutes. The cervix is 5 cm 60-70% effaced and the vertex is at - 2 station.

## 2015-06-08 NOTE — H&P (Signed)
NAME:  Michelle Holder, [name]     ACCOUNT NO.:  [id number]  MEDICAL RECORD NO.:  [id number]  LOCATION:  9175                          FACILITY:  [facility]  PHYSICIAN:  Malachi Prohomas F. Ambrose MantleHenley, M.D. DATE OF BIRTH:  Jun 10, 1981  DATE OF ADMISSION:  06/08/2015 DATE OF DISCHARGE:                             HISTORY & PHYSICAL   PRESENT ILLNESS:  This is a 34 year old black female, para 2-0-1-3, gravida 4, who is admitted for induction of labor.  The patient has had 2 vaginal deliveries followed by C-section.  With her C section, she was placed into labor for high blood pressure and proteinuria, but never dilated.  She declines screening tests.  Her blood pressure has been an issue for 5 years.  She was placed on Aldomet on January 2016, and she states it made her feel horrible.  It caused dizziness, drowsiness, headaches, and she could not function.  She stopped it on January 05, 2015.  Her C-section was a low-transverse C-section.  Since beginning her prenatal course on December 29, 2014, her blood pressure initially was 158/100 and 164/100.  Subsequently, it came down.  Kidney function tests were done, which were normal.  Her initial weight was 230 pounds, total weight gain with the pregnancy has been 18 pounds.  Her blood pressures remain consistently in the 140 to 150 over 90 to a 100 range without medication.  She was placed on nonstress test twice a week, which had been reactive.  Her cervix was unfavorable at 36 weeks and 5 days, 1 cm, 20%, however, now the cervix is more favorable 2 cm, 30% vertex at a -3.  She is 39 weeks and 4 days and is ready for induction of labor.  [facility] and explained to them my reluctance to give her Pitocin for induction, because of her history of a C-section and the fact that it is not possible to say whether it is a 1 or 2 layer closure.  They suggested using a Foley bulb and rupturing the membranes and that is what  we planned to do.  Her initial ultrasound suggested a gestational age of [redacted] weeks and 2 days with [facility].  Her menstrual history suggested a due date of June 11, 2015, and that was the date chosen as her due date.  Blood group and type AB positive, negative antibody, RPR negative.  Urine culture negative.  Hepatitis B surface antigen negative, HIV negative, GC and Chlamydia negative.  Varicella immune. Rubella not immune.  Hemoglobin electrophoresis showed normal adult hemoglobin.  One-hour Glucola was 52.  Repeat HIV and RPR negative. Group B Strep positive.  PAST MEDICAL HISTORY:  Reveals she has a history of high blood pressure. Has a history of thyroid nodule but is no longer present.  She has a history of obesity, a BMI of over 40.  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  She also had a laparoscopy for that and a C-section in 2010.  ALLERGIES:  No known drug allergies.  No latex or food allergies.  SOCIAL HISTORY:  The patient never smoked.  She does not drink.  Denies drugs.  FAMILY HISTORY:  Mother with a  heart murmur, brother trisomy 69.  OBSTETRIC HISTORY:  Two vaginal deliveries followed by a C-section.  ADMITTING PHYSICAL EXAM:  GENERAL:  Well-developed black female, somewhat overweight, weighing 248.3 pounds. VITAL SIGNS:  Blood pressure 148/90, pulse of 80. HEART:  Normal size and sounds.  No murmurs. LUNGS:  Clear to auscultation. PELVIC:  Fundal height 37 cm.  Fetal heart tones normal.  Cervix 2 cm, 30%, vertex at a -3.  ADMITTING IMPRESSION:  Intrauterine pregnancy at 39 weeks and 4 days, prior C-section, desires vaginal birth after cesarean, history of hypertension.  The patient is admitted for attempted induction of labor with ruptured membranes, Foley bulb, and then possibly gradually increasing doses of Pitocin.  The patient understands the risks of vaginal birth after cesarean and is ready to proceed.     Malachi Pro. Ambrose Mantle,  M.D.     TFH/MEDQ  D:  06/07/2015  T:  06/07/2015  Job:  [id number]

## 2015-06-08 NOTE — Progress Notes (Signed)
Patient ID: Michelle Holder, female   DOB: 07/29/1981, 34 y.o.   MRN: [id number] The pt is contracting but irregularly. The cervix is 4 cm 50 % effaced and the vertex is at - 2 station and the cervix is somewhat posterior. I will start pitocin

## 2015-06-08 NOTE — Progress Notes (Signed)
Patient ID: Michelle Holder, female   DOB: 09/01/1981, 34 y.o.   MRN: [id number] Pt is very uncomfortable and cervix is 5 cm 80% effaced and the vertex is at -1/0 station.

## 2015-06-09 ENCOUNTER — Inpatient Hospital Stay (HOSPITAL_COMMUNITY): Payer: Medicaid Other | Admitting: Anesthesiology

## 2015-06-09 ENCOUNTER — Encounter (HOSPITAL_COMMUNITY): Payer: Self-pay

## 2015-06-09 MED ORDER — PHENYLEPHRINE 40 MCG/ML (10ML) SYRINGE FOR IV PUSH (FOR BLOOD PRESSURE SUPPORT)
PREFILLED_SYRINGE | INTRAVENOUS | Status: AC
  Filled 2015-06-09: qty 20

## 2015-06-09 MED ORDER — TETANUS-DIPHTH-ACELL PERTUSSIS 5-2.5-18.5 LF-MCG/0.5 IM SUSP: 0.5000 mL | Freq: Once | INTRAMUSCULAR | Status: DC

## 2015-06-09 MED ORDER — DIPHENHYDRAMINE HCL 25 MG PO CAPS: 25.0000 mg | ORAL_CAPSULE | Freq: Four times a day (QID) | ORAL | Status: DC | PRN

## 2015-06-09 MED ORDER — EPHEDRINE 5 MG/ML INJ: 10.0000 mg | INTRAVENOUS | Status: DC | PRN

## 2015-06-09 MED ORDER — PRENATAL MULTIVITAMIN CH
1.0000 | ORAL_TABLET | Freq: Every day | ORAL | Status: DC
  Administered 2015-06-09: 1 via ORAL
  Filled 2015-06-09 (×2): qty 1

## 2015-06-09 MED ORDER — ONDANSETRON HCL 4 MG PO TABS: 4.0000 mg | ORAL_TABLET | ORAL | Status: DC | PRN

## 2015-06-09 MED ORDER — ACETAMINOPHEN 325 MG PO TABS: 650.0000 mg | ORAL_TABLET | ORAL | Status: DC | PRN

## 2015-06-09 MED ORDER — SENNOSIDES-DOCUSATE SODIUM 8.6-50 MG PO TABS
2.0000 | ORAL_TABLET | ORAL | Status: DC
  Administered 2015-06-10 (×2): 2 via ORAL
  Filled 2015-06-09 (×2): qty 2

## 2015-06-09 MED ORDER — FENTANYL 2.5 MCG/ML BUPIVACAINE 1/10 % EPIDURAL INFUSION (WH - ANES)
14.0000 mL/h | INTRAMUSCULAR | Status: DC | PRN
  Administered 2015-06-09: 14 mL/h via EPIDURAL

## 2015-06-09 MED ORDER — IBUPROFEN 600 MG PO TABS
600.0000 mg | ORAL_TABLET | Freq: Four times a day (QID) | ORAL | Status: DC
  Administered 2015-06-09 – 2015-06-11 (×9): 600 mg via ORAL
  Filled 2015-06-09 (×9): qty 1

## 2015-06-09 MED ORDER — FENTANYL 2.5 MCG/ML BUPIVACAINE 1/10 % EPIDURAL INFUSION (WH - ANES): 14.0000 mL/h | INTRAMUSCULAR | Status: DC | PRN

## 2015-06-09 MED ORDER — ONDANSETRON HCL 4 MG/2ML IJ SOLN: 4.0000 mg | INTRAMUSCULAR | Status: DC | PRN

## 2015-06-09 MED ORDER — FENTANYL 2.5 MCG/ML BUPIVACAINE 1/10 % EPIDURAL INFUSION (WH - ANES)
INTRAMUSCULAR | Status: DC | PRN
  Administered 2015-06-09: 14 mL/h via EPIDURAL

## 2015-06-09 MED ORDER — LACTATED RINGERS IV SOLN: INTRAVENOUS | Status: AC

## 2015-06-09 MED ORDER — PRENATAL MULTIVITAMIN CH: 1.0000 | ORAL_TABLET | Freq: Every day | ORAL | Status: DC

## 2015-06-09 MED ORDER — DIPHENHYDRAMINE HCL 50 MG/ML IJ SOLN: 12.5000 mg | INTRAMUSCULAR | Status: DC | PRN

## 2015-06-09 MED ORDER — PHENYLEPHRINE 40 MCG/ML (10ML) SYRINGE FOR IV PUSH (FOR BLOOD PRESSURE SUPPORT): 80.0000 ug | PREFILLED_SYRINGE | INTRAVENOUS | Status: DC | PRN

## 2015-06-09 MED ORDER — SIMETHICONE 80 MG PO CHEW: 80.0000 mg | CHEWABLE_TABLET | ORAL | Status: DC | PRN

## 2015-06-09 MED ORDER — LIDOCAINE HCL (PF) 1 % IJ SOLN
INTRAMUSCULAR | Status: DC | PRN
  Administered 2015-06-09: 8 mL
  Administered 2015-06-09: 2 mL

## 2015-06-09 MED ORDER — FENTANYL 2.5 MCG/ML BUPIVACAINE 1/10 % EPIDURAL INFUSION (WH - ANES)
INTRAMUSCULAR | Status: AC
  Filled 2015-06-09: qty 125

## 2015-06-09 MED ORDER — DIBUCAINE 1 % RE OINT
1.0000 "application " | TOPICAL_OINTMENT | RECTAL | Status: DC | PRN
  Filled 2015-06-09: qty 28

## 2015-06-09 MED ORDER — MEASLES, MUMPS & RUBELLA VAC ~~LOC~~ INJ
0.5000 mL | INJECTION | Freq: Once | SUBCUTANEOUS | Status: DC
  Filled 2015-06-09: qty 0.5

## 2015-06-09 MED ORDER — OXYCODONE-ACETAMINOPHEN 5-325 MG PO TABS
1.0000 | ORAL_TABLET | ORAL | Status: DC | PRN
  Administered 2015-06-09 – 2015-06-11 (×4): 1 via ORAL
  Filled 2015-06-09 (×4): qty 1

## 2015-06-09 MED ORDER — OXYCODONE-ACETAMINOPHEN 5-325 MG PO TABS
2.0000 | ORAL_TABLET | ORAL | Status: DC | PRN
  Administered 2015-06-09 – 2015-06-10 (×4): 2 via ORAL
  Filled 2015-06-09 (×5): qty 2

## 2015-06-09 MED ORDER — LANOLIN HYDROUS EX OINT: TOPICAL_OINTMENT | CUTANEOUS | Status: DC | PRN

## 2015-06-09 MED ORDER — BENZOCAINE-MENTHOL 20-0.5 % EX AERO
1.0000 "application " | INHALATION_SPRAY | CUTANEOUS | Status: DC | PRN
  Administered 2015-06-09 – 2015-06-11 (×3): 1 via TOPICAL
  Filled 2015-06-09 (×4): qty 56

## 2015-06-09 MED ORDER — ZOLPIDEM TARTRATE 5 MG PO TABS: 5.0000 mg | ORAL_TABLET | Freq: Every evening | ORAL | Status: DC | PRN

## 2015-06-09 MED ORDER — WITCH HAZEL-GLYCERIN EX PADS: 1.0000 "application " | MEDICATED_PAD | CUTANEOUS | Status: DC | PRN

## 2015-06-09 MED ORDER — OXYTOCIN 40 UNITS IN LACTATED RINGERS INFUSION - SIMPLE MED: 62.5000 mL/h | INTRAVENOUS | Status: AC

## 2015-06-09 NOTE — Progress Notes (Signed)
Patient ID: Michelle Holder, female   DOB: 1981-09-26, 34 y.o.   MRN: [id number] DOD no problems or complaints

## 2015-06-09 NOTE — Progress Notes (Signed)
Patient ID: Michelle Holder, female   DOB: October 23, 1981, 34 y.o.   MRN: [id number] She rapidly progressed to full dilatation and delivered a living female infant spontaneously ROA over an intact perineum. Apgars 9 and 9 at 1 and 5 minutes. The placenta was intact and the uterus was normal. Bilateral labial lacerations were bleeding and sutured with 3-0 vicryl under local block. EBL 326 cc's.

## 2015-06-09 NOTE — Anesthesia Procedure Notes (Signed)
Epidural Patient location during procedure: OB Start time: 06/09/2015 12:32 AM End time: 06/09/2015 12:48 AM  Staffing Anesthesiologist: Sebastian Ache  Preanesthetic Checklist Completed: patient identified, site marked, surgical consent, pre-op evaluation, timeout performed, IV checked, risks and benefits discussed and monitors and equipment checked  Epidural Patient position: sitting Prep: site prepped and draped and DuraPrep Patient monitoring: heart rate, continuous pulse ox and blood pressure Approach: midline Location: L3-L4 Injection technique: LOR air  Needle:  Needle type: Tuohy  Needle gauge: 17 G Needle length: 9 cm and 9 Needle insertion depth: 6 cm Catheter type: closed end flexible Catheter size: 19 Gauge Catheter at skin depth: 15 cm Test dose: negative  Assessment Events: blood not aspirated, injection not painful, no injection resistance, negative IV test and no paresthesia  Additional Notes   Patient tolerated the insertion well without complications.Reason for block:procedure for pain

## 2015-06-09 NOTE — Progress Notes (Signed)
Patient ID: Michelle Holder, female   DOB: February 07, 1981, 34 y.o.   MRN: [id number] Pt requested and received an epidural. The cervix is 5-6 cm 80-90% effaced and the vertex is at - 1 station.

## 2015-06-09 NOTE — Anesthesia Preprocedure Evaluation (Signed)
Anesthesia Evaluation  Patient identified by MRN, date of birth, ID band Patient awake and Patient confused    Reviewed: Allergy & Precautions, H&P , NPO status , Patient's Chart, lab work & pertinent test results  Airway Mallampati: II       Dental   Pulmonary  breath sounds clear to auscultation  Pulmonary exam normal       Cardiovascular Exercise Tolerance: Good hypertension, Normal cardiovascular examRhythm:regular Rate:Normal     Neuro/Psych    GI/Hepatic   Endo/Other  Morbid obesity  Renal/GU      Musculoskeletal   Abdominal   Peds  Hematology   Anesthesia Other Findings   Reproductive/Obstetrics (+) Pregnancy                             Anesthesia Physical Anesthesia Plan  ASA: II  Anesthesia Plan: Epidural   Post-op Pain Management:    Induction:   Airway Management Planned:   Additional Equipment:   Intra-op Plan:   Post-operative Plan:   Informed Consent:   Plan Discussed with:   Anesthesia Plan Comments:         Anesthesia Quick Evaluation

## 2015-06-09 NOTE — Progress Notes (Signed)
UR chart review completed.  

## 2015-06-10 LAB — CBC
HEMATOCRIT: 31.3 % — AB (ref 36.0–46.0)
Hemoglobin: 10.2 g/dL — ABNORMAL LOW (ref 12.0–15.0)
MCH: 30.3 pg (ref 26.0–34.0)
MCHC: 32.6 g/dL (ref 30.0–36.0)
MCV: 92.9 fL (ref 78.0–100.0)
Platelets: 202 10*3/uL (ref 150–400)
RBC: 3.37 MIL/uL — ABNORMAL LOW (ref 3.87–5.11)
RDW: 14 % (ref 11.5–15.5)
WBC: 8.1 10*3/uL (ref 4.0–10.5)

## 2015-06-10 NOTE — Progress Notes (Signed)
Patient ID: Michelle Holder, female   DOB: Jun 29, 1981, 34 y.o.   MRN: [id number] #1 afebrile BP remaining normal HGB stable. Declines d/c

## 2015-06-10 NOTE — Lactation Note (Addendum)
This note was copied from the chart of Michelle Holder. Lactation Consultation Note Experienced BF mom states everything going well. Baby cluster feeding, baby crying a lot. Mom said she has been on the breast and when you take her off she screams. FOB talking to baby. Mom tells dad to pick baby up and hold her. Mom states has good output. She has BF 3 other children and done fine. She has no questions and faced the other ways and would glance at me once in a while. Mom is tired. Explained the cluster feeding, mom states she remembers. Encouraged mom to obtain a deep latch to prevent soreness and do plenty of STS. Educated about newborn behavior. Reviewed BF support group and out pt. Services. Reported to RN about baby crying a lot. Patient Name: Michelle Holder [id number] Date: 06/10/2015 Reason for consult: Initial assessment   Maternal Data    Feeding Feeding Type: Breast Fed Length of feed: 20 min  LATCH Score/Interventions                      Lactation Tools Discussed/Used     Consult Status Consult Status: PRN    Charyl Dancer 06/10/2015, 1:43 AM

## 2015-06-11 MED ORDER — IBUPROFEN 800 MG PO TABS: 800.0000 mg | ORAL_TABLET | Freq: Three times a day (TID) | ORAL | Status: DC | PRN

## 2015-06-11 MED ORDER — OXYCODONE-ACETAMINOPHEN 5-325 MG PO TABS: 1.0000 | ORAL_TABLET | Freq: Four times a day (QID) | ORAL | Status: DC | PRN

## 2015-06-11 MED ORDER — PRENATAL MULTIVITAMIN CH: 1.0000 | ORAL_TABLET | Freq: Every day | ORAL | Status: DC

## 2015-06-11 NOTE — Progress Notes (Signed)
Post Partum Day 2 Subjective: no complaints, up ad lib, voiding, tolerating PO and nl lochia, pain controlled  Objective: Blood pressure 145/77, pulse 57, temperature 98.4 F (36.9 C), temperature source Oral, resp. rate 18, height  (1.727 m), weight 112.492 kg (248 lb), SpO2 100 %, unknown if currently breastfeeding.  Physical Exam:  General: alert and no distress Lochia: appropriate Uterine Fundus: firm   Recent Labs  06/08/15 0830 06/10/15 0500  HGB 10.5* 10.2*  HCT 32.1* 31.3*    Assessment/Plan: Plan for discharge tomorrow, Breastfeeding and Lactation consult.  Routine care.  D/c with motrin, percocet and PNV - f/u 6 weeks   LOS: 3 days   Bovard-Stuckert, Nessie Nong 06/11/2015, 8:04 AM

## 2015-06-11 NOTE — Lactation Note (Signed)
This note was copied from the chart of Michelle Afsa Jaely Silman. Lactation Consultation Note  Patient Name: Michelle Holder [id number] Date: 06/11/2015 Reason for consult: Follow-up assessment;Other (Comment) (5% weight loss ) Baby is 14 hours old and has been consistent @ the breast , Breast feeding range - latch Scores 7-8-9. 10 - 30 mins. Per mom breast are feeling fuller and heavier. LC reviewed sore nipple and engorgement prevention and tx. Per mom has a DEBP at home. LC referred to the Baby and me booklet for BFfeeding resources. Mother informed of post-discharge support and given phone number to the lactation department, including services for phone call assistance; out-patient appointments; and breastfeeding support group. List of other breastfeeding resources in the community given in the handout. Encouraged mother to call for problems or concerns related to breastfeeding.Mother informed of post-discharge support and given phone number to the lactation department, including services for phone call assistance; out-patient appointments; and breastfeeding support group. List of other breastfeeding resources in the community given in the handout. Encouraged mother to call for problems or concerns related to breastfeeding.    Maternal Data    Feeding Feeding Type:  (per mom baby recently fed ) Length of feed: 30 min  LATCH Score/Interventions Latch: Grasps breast easily, tongue down, lips flanged, rhythmical sucking.  Audible Swallowing: Spontaneous and intermittent Intervention(s): Skin to skin;Hand expression Intervention(s): Skin to skin;Hand expression  Type of Nipple: Everted at rest and after stimulation  Comfort (Breast/Nipple): Filling, red/small blisters or bruises, mild/mod discomfort  Problem noted: Filling Interventions (Filling): Massage;Reverse pressure;Frequent nursing Interventions (Mild/moderate discomfort): Hand expression  Hold (Positioning): No  assistance needed to correctly position infant at breast. Intervention(s): Breastfeeding basics reviewed  LATCH Score: 9  Lactation Tools Discussed/Used Initiated by:: MAI  Date initiated:: 06/11/15   Consult Status Consult Status: Complete    Michelle Holder 06/11/2015, 10:22 AM

## 2015-06-11 NOTE — Discharge Summary (Signed)
Obstetric Discharge Summary Reason for Admission: induction of labor Prenatal Procedures: none Intrapartum Procedures: VBAC Postpartum Procedures: none Complications-Operative and Postpartum: B labial  laceration HEMOGLOBIN  Date Value Ref Range Status  06/10/2015 10.2* 12.0 - 15.0 g/dL Final   HGB  Date Value Ref Range Status  10/23/2014 12.0 12.0-16.0 g/dL Final   HCT  Date Value Ref Range Status  06/10/2015 31.3* 36.0 - 46.0 % Final  10/23/2014 37.8 35.0-47.0 % Final    Physical Exam:  General: alert and no distress Lochia: appropriate Uterine Fundus: firm  Discharge Diagnoses: Term Pregnancy-delivered  Discharge Information: Date: 06/11/2015 Activity: pelvic rest Diet: routine Medications: PNV, Ibuprofen and Percocet Condition: stable Instructions: refer to practice specific booklet Discharge to: home Follow-up Information    Follow up with Bing Plume, MD. Schedule an appointment as soon as possible for a visit in 6 weeks.   Specialty:  Obstetrics and Gynecology   Why:  for postpartum check   Contact information:   [address], [address] [place] [address] [phone]       Newborn Data: Live born female  Birth Weight: 6 lb 10 oz (3005 g) APGAR: 9, 9  Home with mother.  Bovard-Stuckert, Delontae Lamm 06/11/2015, 8:43 AM

## 2015-06-11 NOTE — Progress Notes (Addendum)
Rn called Dr. Seymour Bars rgarding patient's BP of 144/77. No  Further orders were given.

## 2015-06-11 NOTE — Plan of Care (Signed)
Problem: Discharge Progression Outcomes Goal: MMR given as ordered Outcome: Not Met (add Reason) Pt declined MMR vaccination.  VIS given and risks of not getting MMR vaccination discussed with pt.  Pt verbalizes understanding and declines vaccination.

## 2015-06-11 NOTE — Progress Notes (Signed)
Rn discussed information regarding MMR and the importance . Mom still refused vaccination. Rn gave mom information.

## 2015-06-24 ENCOUNTER — Encounter: Payer: Self-pay | Admitting: Emergency Medicine

## 2015-06-24 ENCOUNTER — Other Ambulatory Visit: Payer: Self-pay

## 2015-06-24 ENCOUNTER — Emergency Department: Payer: Medicaid Other

## 2015-06-24 ENCOUNTER — Emergency Department
Admission: EM | Admit: 2015-06-24 | Discharge: 2015-06-24 | Disposition: A | Discharge status: Home / Self Care | Payer: Medicaid Other | Attending: Emergency Medicine | Admitting: Emergency Medicine

## 2015-06-24 DIAGNOSIS — O1003 Pre-existing essential hypertension complicating the puerperium: Secondary | ICD-10-CM | POA: Diagnosis not present

## 2015-06-24 DIAGNOSIS — R0789 Other chest pain: Secondary | ICD-10-CM | POA: Diagnosis not present

## 2015-06-24 DIAGNOSIS — Z79899 Other long term (current) drug therapy: Secondary | ICD-10-CM | POA: Insufficient documentation

## 2015-06-24 DIAGNOSIS — O9089 Other complications of the puerperium, not elsewhere classified: Secondary | ICD-10-CM | POA: Insufficient documentation

## 2015-06-24 DIAGNOSIS — R079 Chest pain, unspecified: Secondary | ICD-10-CM

## 2015-06-24 LAB — COMPREHENSIVE METABOLIC PANEL
ALBUMIN: 3.4 g/dL — AB (ref 3.5–5.0)
ALT: 15 U/L (ref 14–54)
AST: 18 U/L (ref 15–41)
Alkaline Phosphatase: 76 U/L (ref 38–126)
Anion gap: 7 (ref 5–15)
BUN: 9 mg/dL (ref 6–20)
CO2: 27 mmol/L (ref 22–32)
Calcium: 8.5 mg/dL — ABNORMAL LOW (ref 8.9–10.3)
Chloride: 105 mmol/L (ref 101–111)
Creatinine, Ser: 0.82 mg/dL (ref 0.44–1.00)
GLUCOSE: 84 mg/dL (ref 65–99)
Potassium: 3.7 mmol/L (ref 3.5–5.1)
Sodium: 139 mmol/L (ref 135–145)
Total Bilirubin: 0.6 mg/dL (ref 0.3–1.2)
Total Protein: 6.5 g/dL (ref 6.5–8.1)

## 2015-06-24 LAB — URINALYSIS COMPLETE WITH MICROSCOPIC (ARMC ONLY)
BILIRUBIN URINE: NEGATIVE
GLUCOSE, UA: NEGATIVE mg/dL
Ketones, ur: NEGATIVE mg/dL
Nitrite: NEGATIVE
Protein, ur: NEGATIVE mg/dL
RBC / HPF: NONE SEEN RBC/hpf (ref 0–5)
Specific Gravity, Urine: 1.011 (ref 1.005–1.030)
pH: 7 (ref 5.0–8.0)

## 2015-06-24 LAB — CBC WITH DIFFERENTIAL/PLATELET
Basophils Absolute: 0 10*3/uL (ref 0–0.1)
Basophils Relative: 1 %
EOS ABS: 0.2 10*3/uL (ref 0–0.7)
Eosinophils Relative: 7 %
HEMATOCRIT: 39.3 % (ref 35.0–47.0)
Hemoglobin: 12.9 g/dL (ref 12.0–16.0)
LYMPHS PCT: 27 %
Lymphs Abs: 0.8 10*3/uL — ABNORMAL LOW (ref 1.0–3.6)
MCH: 30.4 pg (ref 26.0–34.0)
MCHC: 32.7 g/dL (ref 32.0–36.0)
MCV: 93 fL (ref 80.0–100.0)
MONO ABS: 0.2 10*3/uL (ref 0.2–0.9)
Monocytes Relative: 7 %
NEUTROS PCT: 58 %
Neutro Abs: 1.6 10*3/uL (ref 1.4–6.5)
Platelets: 237 10*3/uL (ref 150–440)
RBC: 4.23 MIL/uL (ref 3.80–5.20)
RDW: 13.6 % (ref 11.5–14.5)
WBC: 2.8 10*3/uL — AB (ref 3.6–11.0)

## 2015-06-24 LAB — LACTATE DEHYDROGENASE: LDH: 160 U/L (ref 98–192)

## 2015-06-24 LAB — URIC ACID: Uric Acid, Serum: 5.5 mg/dL (ref 2.3–6.6)

## 2015-06-24 MED ORDER — CLONIDINE HCL 0.1 MG PO TABS: 0.2000 mg | ORAL_TABLET | Freq: Once | ORAL | Status: DC

## 2015-06-24 MED ORDER — HYDRALAZINE HCL 20 MG/ML IJ SOLN
10.0000 mg | Freq: Once | INTRAMUSCULAR | Status: AC
  Administered 2015-06-24: 10 mg via INTRAVENOUS

## 2015-06-24 MED ORDER — HYDRALAZINE HCL 20 MG/ML IJ SOLN
5.0000 mg | Freq: Once | INTRAMUSCULAR | Status: AC
  Administered 2015-06-24: 5 mg via INTRAVENOUS
  Filled 2015-06-24: qty 1

## 2015-06-24 MED ORDER — HYDROCODONE-ACETAMINOPHEN 5-325 MG PO TABS
ORAL_TABLET | ORAL | Status: AC
  Administered 2015-06-24: 1 via ORAL
  Filled 2015-06-24: qty 1

## 2015-06-24 MED ORDER — HYDROCODONE-ACETAMINOPHEN 5-325 MG PO TABS
1.0000 | ORAL_TABLET | Freq: Once | ORAL | Status: AC
  Administered 2015-06-24: 1 via ORAL

## 2015-06-24 MED ORDER — HYDROCODONE-ACETAMINOPHEN 5-300 MG PO TABS: 1.0000 | ORAL_TABLET | Freq: Three times a day (TID) | ORAL | Status: AC | PRN

## 2015-06-24 MED ORDER — CLONIDINE HCL 0.1 MG PO TABS: 0.1000 mg | ORAL_TABLET | Freq: Three times a day (TID) | ORAL | Status: DC | PRN

## 2015-06-24 NOTE — Discharge Instructions (Signed)
Chest Wall Pain Chest wall pain is pain in or around the bones and muscles of your chest. It may take up to 6 weeks to get better. It may take longer if you must stay physically active in your work and activities.  CAUSES  Chest wall pain may happen on its own. However, it may be caused by:  A viral illness like the flu.  Injury.  Coughing.  Exercise.  Arthritis.  Fibromyalgia.  Shingles. HOME CARE INSTRUCTIONS   Avoid overtiring physical activity. Try not to strain or perform activities that cause pain. This includes any activities using your chest or your abdominal and side muscles, especially if heavy weights are used.  Put ice on the sore area.  Put ice in a plastic bag.  Place a towel between your skin and the bag.  Leave the ice on for 15-20 minutes per hour while awake for the first 2 days.  Only take over-the-counter or prescription medicines for pain, discomfort, or fever as directed by your caregiver. SEEK IMMEDIATE MEDICAL CARE IF:   Your pain increases, or you are very uncomfortable.  You have a fever.  Your chest pain becomes worse.  You have new, unexplained symptoms.  You have nausea or vomiting.  You feel sweaty or lightheaded.  You have a cough with phlegm (sputum), or you cough up blood. MAKE SURE YOU:   Understand these instructions.  Will watch your condition.  Will get help right away if you are not doing well or get worse. Document Released: 11/05/2005 Document Revised: 01/28/2012 Document Reviewed: 07/02/2011 [facility] Patient Information 2015 [place], [place]. This information is not intended to replace advice given to you by your health care provider. Make sure you discuss any questions you have with your health care provider.  Please keep close observation of her blood pressure and take the clonidine as needed. Intact her OB/GYN early next week for follow-up as soon as possible. Return if you chest pain has associated shortness of  breath, vomiting, fever or any other new concerns.

## 2015-06-24 NOTE — ED Notes (Signed)
Patient to ER for c/o chest pain since this am. Denies any N/V, shortness of breath, or diaphoresis. Patient reports having vaginal delivery two weeks ago. Also reports having some blood pressure inconsistencies.

## 2015-06-24 NOTE — ED Provider Notes (Signed)
[facility] Emergency Department Provider Note  ____________________________________________  Time seen: 1145  I have reviewed the triage vital signs and the nursing notes.   HISTORY  Chief Complaint Chest Pain    HPI Michelle Holder is a 34 y.o. female who presents after a recent uneventful postpartum delivery. Patient apparently had a history of hypertension and prior to her pregnancy. She states it seemed to be somewhat labile as far as her blood pressure and dependent on the size of a cough. She is on blood pressure medications that she states when she took them she got very lightheaded and felt poorly and stopped taking them with her pregnancy. She had no episodes of hypotension throughout her pregnancy and had an uneventful vaginal delivery. Patient's approximately 2 weeks postpartum and is now developed some mild chest pain. She points mainly to the upper middle chest wall region and superior sternal area. She denies any true shortness of breath, nausea, vomiting. She denies any radiation of the pain to the arm, jaw, or back region. She denies any fever or chills or productive cough. She denies any past drainage or skin lesions. She has been breast-feeding uneventfully. She states she stopped taking the Percocet for pain and thought it might be discomfort secondary to the breast-feeding and some chest wall pain. Slightly hypertensive with diastolic pressures in the 95 range. The patient denies any peripheral edema, abdominal pain, or heavy vaginal bleeding.   Past Medical History  Diagnosis Date  . Hypertension   . Thyroid disease     thyroid nodule  . AMA (advanced maternal age) multigravida 35+     Patient Active Problem List   Diagnosis Date Noted  . SVD (spontaneous vaginal delivery) 06/09/2015  . Pregnancy 06/08/2015    Past Surgical History  Procedure Laterality Date  . Cesarean section    . Laparoscopic gastrectomy      SILS     Current Outpatient Rx  Name  Route  Sig  Dispense  Refill  . ibuprofen (ADVIL,MOTRIN) 800 MG tablet   Oral   Take 1 tablet (800 mg total) by mouth every 8 (eight) hours as needed.   45 tablet   1   . Prenatal Vit-Fe Fumarate-FA (PRENATAL MULTIVITAMIN) TABS tablet   Oral   Take 1 tablet by mouth daily at 12 noon.   30 tablet   12   . cloNIDine (CATAPRES) 0.1 MG tablet   Oral   Take 1 tablet (0.1 mg total) by mouth 3 (three) times daily as needed.   15 tablet   11   . Hydrocodone-Acetaminophen 5-300 MG TABS   Oral   Take 1 tablet by mouth 3 (three) times daily between meals as needed.   21 each   0     Allergies Review of patient's allergies indicates no known allergies.  No family history on file.  Social History History  Substance Use Topics  . Smoking status: Never Smoker   . Smokeless tobacco: Never Used  . Alcohol Use: No    Review of Systems  Constitutional: Negative for fever. Eyes: Negative for visual changes. ENT: Negative for sore throat Cardiovascular: Negative for chest pain. Respiratory: Negative for shortness of breath. Gastrointestinal: Negative for abdominal pain, vomiting and diarrhea. Genitourinary: Negative for dysuria. Musculoskeletal: Negative for back pain. Skin: Negative for rash. Neurological: Negative for headaches or focal weakness     ____________________________________________   PHYSICAL EXAM:  VITAL SIGNS: ED Triage Vitals  Enc Vitals Group  BP 06/24/15 1046 152/94 mmHg     Pulse Rate 06/24/15 1046 79     Resp 06/24/15 1046 22     Temp 06/24/15 1046 98.2 F (36.8 C)     Temp Source 06/24/15 1046 Oral     SpO2 06/24/15 1046 97 %     Weight 06/24/15 1035 230 lb (104.327 kg)     Height 06/24/15 1035  (1.727 m)     Head Cir --      Peak Flow --      Pain Score 06/24/15 1035 3     Pain Loc --      Pain Edu? --      Excl. in GC? --      Constitutional: Alert and oriented. Well appearing and in no  distress. Eyes: Conjunctivae are normal. No papilledema ENT   Head: Normocephalic and atraumatic.   Mouth/Throat: Mucous membranes are moist. Cardiovascular: Normal rate, regular rhythm. Normal and symmetric distal pulses are present in all extremities. No murmurs, rubs, or gallops. Respiratory: Normal respiratory effort without tachypnea nor retractions. Breath sounds are clear and equal bilaterally.  Gastrointestinal: Soft and non-tender in all quadrants. No distention. There is no CVA tenderness. Genitourinary: deferred Musculoskeletal: Nontender with normal range of motion in all extremities. No lower extremity tenderness nor edema. Neurologic:  Normal speech and language. No gross focal neurologic deficits are appreciated. Skin:  Skin is warm, dry and intact. No rash noted. Psychiatric: Mood and affect are normal. Patient exhibits appropriate insight and judgment.  _Patient has some mild reproducible pain over the chest wall region ___________________________________________    LABS (pertinent positives/negatives)  Labs Reviewed  COMPREHENSIVE METABOLIC PANEL - Abnormal; Notable for the following:    Calcium 8.5 (*)    Albumin 3.4 (*)    All other components within normal limits  URINALYSIS COMPLETEWITH MICROSCOPIC ([facility] ONLY) - Abnormal; Notable for the following:    Color, Urine YELLOW (*)    APPearance CLEAR (*)    Hgb urine dipstick 2+ (*)    Leukocytes, UA 1+ (*)    Bacteria, UA RARE (*)    Squamous Epithelial / LPF 0-5 (*)    All other components within normal limits  CBC WITH DIFFERENTIAL/PLATELET - Abnormal; Notable for the following:    WBC 2.8 (*)    Lymphs Abs 0.8 (*)    All other components within normal limits  URIC ACID  LACTATE DEHYDROGENASE   Laboratory work reviewed and shows no significant abnormalities. ____________________________________________   EKG  ED ECG REPORT I, Jennye Moccasin, the attending physician, personally viewed and  interpreted this ECG.  Date: 06/24/2015 EKG Time: 1038 Rate: 72 Rhythm: normal sinus rhythm QRS Axis: normal Intervals: normal ST/T Wave abnormalities: normal Conduction Disutrbances: none Narrative Interpretation: unremarkable   ____________________________________________    RADIOLOGY I have personally reviewed any xrays that were ordered on this patient: Chest x-ray was unremarkable  ____________________________________________   ____________________________________________   INITIAL IMPRESSION / ASSESSMENT AND PLAN / ED COURSE  Pertinent labs & imaging results that were available during my care of the patient were reviewed by me and considered in my medical decision making (see chart for details). There is no current clinical indications of preeclampsia. The patient remained hemodynamically stable and was given hydralazine low dose and had some mild improvement but also improved with some by mouth Vicodin for pain. Chest pain is felt was unlikely to be from a life-threatening source such as a pulmonary embolism, postpartum cardiomyopathy, acute  coronary syndrome, etc. Given the reproducible nature of the pain is felt this most likely is musculoskeletal chest wall pain. Patient will be prescribed Vicodin for pain and was given a prescription for clonidine if her blood pressure becomes elevated once again. She was discharged with a blood pressure 133/83. HE isn't concerned with aggressive bedside and she is otherwise contact her OB/GYN for follow-up. I did speak briefly to the physician assistant on call for Dr. Lang Snow    FINAL CLINICAL IMPRESSION(S) / ED DIAGNOSES  Final diagnoses:  Chest pain of uncertain etiology   Plan is outpatient management. Patient was prescribed clonidine for hypertension and when necessary  Jennye Moccasin, MD 06/24/15 [phone]

## 2015-06-24 NOTE — ED Notes (Signed)
AAOx3.  Skin warm and dry,. NAD.  D/C home

## 2015-07-07 ENCOUNTER — Ambulatory Visit (INDEPENDENT_AMBULATORY_CARE_PROVIDER_SITE_OTHER): Payer: Medicaid Other | Admitting: Family Medicine

## 2015-07-07 ENCOUNTER — Encounter: Payer: Self-pay | Admitting: Family Medicine

## 2015-07-07 VITALS — BP 158/108 | HR 86 | Temp 98.4°F | Resp 14 | Ht 68.0 in | Wt 230.8 lb

## 2015-07-07 DIAGNOSIS — I10 Essential (primary) hypertension: Secondary | ICD-10-CM

## 2015-07-07 DIAGNOSIS — M791 Myalgia, unspecified site: Secondary | ICD-10-CM

## 2015-07-07 DIAGNOSIS — E559 Vitamin D deficiency, unspecified: Secondary | ICD-10-CM

## 2015-07-07 DIAGNOSIS — N946 Dysmenorrhea, unspecified: Secondary | ICD-10-CM | POA: Insufficient documentation

## 2015-07-07 DIAGNOSIS — E876 Hypokalemia: Secondary | ICD-10-CM

## 2015-07-07 DIAGNOSIS — Z23 Encounter for immunization: Secondary | ICD-10-CM

## 2015-07-07 DIAGNOSIS — Z8639 Personal history of other endocrine, nutritional and metabolic disease: Secondary | ICD-10-CM

## 2015-07-07 DIAGNOSIS — E041 Nontoxic single thyroid nodule: Secondary | ICD-10-CM

## 2015-07-07 MED ORDER — TRAMADOL HCL 50 MG PO TABS: 50.0000 mg | ORAL_TABLET | Freq: Three times a day (TID) | ORAL | Status: DC | PRN

## 2015-07-07 NOTE — Patient Instructions (Addendum)

## 2015-07-07 NOTE — Progress Notes (Signed)
Name: Michelle Holder   MRN: [id number]    DOB: 01/26/1981   Date:07/07/2015       Progress Note  Subjective  Chief Complaint  Chief Complaint  Patient presents with  . Hypertension    pain from recent birth went to er for chest pain dx with musular pain  . Migraine    wants refill on foricet  . Thyroid Nodule    needs u/s fro, recent visit and blood work    HPI  Hypertension: however bp was under control during pregnancy without medication, since delivery of her 4th daughter on July 21st, 2016 she has been feeling very sore all over, went to [facility] in August and bp was high until she was given pain medication. She also had chest pain but was diagnosed and chest wall pain. She has not been taking clonidine prn as instructed by [facility] physician  Thyroid nodule: due for follow up with Endo  Leukopenia: last CBC showed leukopenia, but no anemia  Hypocalcemia, hypokalemia: may be the cause of her muscle aches and fatigue  Myalgia: since the birth of her daughter she feels like she was hit by a car, hurts all over, she is sleeping okay. Just aches  Patient Active Problem List   Diagnosis Date Noted  . Dysmenorrhea 07/07/2015  . History of iron deficiency 07/07/2015  . Thyroid nodule 07/07/2015  . Vitamin D deficiency 07/07/2015  . Adult BMI 30+ 03/23/2010  . Benign essential HTN 02/20/2010    Past Surgical History  Procedure Laterality Date  . Cesarean section    . Laparoscopic gastrectomy      SILS    Family History  Problem Relation Age of Onset  . Down syndrome Brother     Social History   Social History  . Marital Status: Married    Spouse Name: N/A  . Number of Children: N/A  . Years of Education: N/A   Occupational History  . Not on file.   Social History Main Topics  . Smoking status: Never Smoker   . Smokeless tobacco: Never Used  . Alcohol Use: No  . Drug Use: No  . Sexual Activity: Yes    Birth Control/ Protection: None   Other Topics Concern   . Not on file   Social History Narrative     Current outpatient prescriptions:  .  ibuprofen (ADVIL,MOTRIN) 800 MG tablet, Take 1 tablet (800 mg total) by mouth every 8 (eight) hours as needed., Disp: 45 tablet, Rfl: 1 .  Prenatal Vit-Fe Fumarate-FA (PRENATAL MULTIVITAMIN) TABS tablet, Take 1 tablet by mouth daily at 12 noon., Disp: 30 tablet, Rfl: 12 .  traMADol (ULTRAM) 50 MG tablet, Take 1 tablet (50 mg total) by mouth every 8 (eight) hours as needed., Disp: 30 tablet, Rfl: 0  No Known Allergies   ROS  Constitutional: Negative for fever or weight change.  Respiratory: Negative for cough and shortness of breath.   Cardiovascular: Negative for chest pain or palpitations.  Gastrointestinal: Negative for abdominal pain, no bowel changes.  Musculoskeletal: Negative for gait problem or joint swelling.  Skin: Negative for rash.  Neurological: Negative for dizziness or headache - not currently had some during pregnancy.  No other specific complaints in a complete review of systems (except as listed in HPI above).  Objective  Filed Vitals:   07/07/15 1542 07/07/15 1555  BP: 160/110 158/108  Pulse: 86   Temp: 98.4 F (36.9 C)   TempSrc: Oral   Resp: 14     Height: 5' 8" (1.727 m)   Weight: 230 lb 12.8 oz (104.69 kg)   SpO2: 98%     Body mass index is 35.1 kg/(m^2).  Physical Exam  Constitutional: Patient appears well-developed and well-nourished. Obese  No distress.  HEENT: head atraumatic, normocephalic, pupils equal and reactive to light neck supple, throat within normal limits Cardiovascular: Normal rate, regular rhythm and normal heart sounds.  No murmur heard. No BLE edema. Pulmonary/Chest: Effort normal and breath sounds normal. No respiratory distress. Abdominal: Soft.  There is no tenderness. Psychiatric: Patient has a normal mood and affect. behavior is normal. Judgment and thought content normal. Muscular skeletal: some pain during trigger point palpation and  during palpation of costochondral area  Recent Results (from the past 2160 hour(s))  Urinalysis, Routine w reflex microscopic (not at [facility])     Status: Abnormal   Collection Time: 05/17/15  7:11 PM  Result Value Ref Range   Color, Urine YELLOW YELLOW   APPearance CLEAR CLEAR   Specific Gravity, Urine 1.025 1.005 - 1.030   pH 6.0 5.0 - 8.0   Glucose, UA NEGATIVE NEGATIVE mg/dL   Hgb urine dipstick SMALL (A) NEGATIVE   Bilirubin Urine NEGATIVE NEGATIVE   Ketones, ur 15 (A) NEGATIVE mg/dL   Protein, ur NEGATIVE NEGATIVE mg/dL   Urobilinogen, UA 0.2 0.0 - 1.0 mg/dL   Nitrite NEGATIVE NEGATIVE   Leukocytes, UA NEGATIVE NEGATIVE  Protein / creatinine ratio, urine     Status: None   Collection Time: 05/17/15  7:11 PM  Result Value Ref Range   Creatinine, Urine 177.00 mg/dL   Total Protein, Urine 16 mg/dL    Comment: NO NORMAL RANGE ESTABLISHED FOR THIS TEST   Protein Creatinine Ratio 0.09 0.00 - 0.15 mg/mg[Cre]  Urine microscopic-add on     Status: Abnormal   Collection Time: 05/17/15  7:11 PM  Result Value Ref Range   Squamous Epithelial / LPF RARE RARE   WBC, UA 0-2 <3 WBC/hpf   RBC / HPF 3-6 <3 RBC/hpf   Bacteria, UA FEW (A) RARE  CBC     Status: Abnormal   Collection Time: 05/17/15  7:33 PM  Result Value Ref Range   WBC 5.8 4.0 - 10.5 K/uL   RBC 3.51 (L) 3.87 - 5.11 MIL/uL   Hemoglobin 10.7 (L) 12.0 - 15.0 g/dL   HCT 31.6 (L) 36.0 - 46.0 %   MCV 90.0 78.0 - 100.0 fL   MCH 30.5 26.0 - 34.0 pg   MCHC 33.9 30.0 - 36.0 g/dL   RDW 13.0 11.5 - 15.5 %   Platelets 235 150 - 400 K/uL  Comprehensive metabolic panel     Status: Abnormal   Collection Time: 05/17/15  7:33 PM  Result Value Ref Range   Sodium 135 135 - 145 mmol/L   Potassium 3.8 3.5 - 5.1 mmol/L   Chloride 106 101 - 111 mmol/L   CO2 23 22 - 32 mmol/L   Glucose, Bld 79 65 - 99 mg/dL   BUN 6 6 - 20 mg/dL   Creatinine, Ser 0.76 0.44 - 1.00 mg/dL   Calcium 8.6 (L) 8.9 - 10.3 mg/dL   Total Protein 6.6 6.5 - 8.1 g/dL    Albumin 3.2 (L) 3.5 - 5.0 g/dL   AST 16 15 - 41 U/L   ALT 15 14 - 54 U/L   Alkaline Phosphatase 125 38 - 126 U/L   Total Bilirubin 0.5 0.3 - 1.2 mg/dL   GFR calc non Af Amer >  60 >60 mL/min   GFR calc Af Amer >60 >60 mL/min    Comment: (NOTE) The eGFR has been calculated using the CKD EPI equation. This calculation has not been validated in all clinical situations. eGFR's persistently <60 mL/min signify possible Chronic Kidney Disease.    Anion gap 6 5 - 15  OB RESULT CONSOLE Group B Strep     Status: None   Collection Time: 06/06/15 12:00 AM  Result Value Ref Range   GBS Positive   CBC     Status: Abnormal   Collection Time: 06/08/15  8:30 AM  Result Value Ref Range   WBC 6.2 4.0 - 10.5 K/uL   RBC 3.50 (L) 3.87 - 5.11 MIL/uL   Hemoglobin 10.5 (L) 12.0 - 15.0 g/dL   HCT 32.1 (L) 36.0 - 46.0 %   MCV 91.7 78.0 - 100.0 fL   MCH 30.0 26.0 - 34.0 pg   MCHC 32.7 30.0 - 36.0 g/dL   RDW 13.7 11.5 - 15.5 %   Platelets 222 150 - 400 K/uL  RPR     Status: None   Collection Time: 06/08/15  8:30 AM  Result Value Ref Range   RPR Ser Ql Non Reactive Non Reactive    Comment: (NOTE) Performed At: [facility] [place], [place] [id number] Lindon Romp MD IW:[id number]   Type and screen     Status: None   Collection Time: 06/08/15 10:25 AM  Result Value Ref Range   ABO/RH(D) AB POS    Antibody Screen NEG    Sample Expiration 06/11/2015   ABO/Rh     Status: None   Collection Time: 06/08/15 10:25 AM  Result Value Ref Range   ABO/RH(D) AB POS   CBC     Status: Abnormal   Collection Time: 06/10/15  5:00 AM  Result Value Ref Range   WBC 8.1 4.0 - 10.5 K/uL   RBC 3.37 (L) 3.87 - 5.11 MIL/uL   Hemoglobin 10.2 (L) 12.0 - 15.0 g/dL   HCT 31.3 (L) 36.0 - 46.0 %   MCV 92.9 78.0 - 100.0 fL   MCH 30.3 26.0 - 34.0 pg   MCHC 32.6 30.0 - 36.0 g/dL   RDW 14.0 11.5 - 15.5 %   Platelets 202 150 - 400 K/uL  Comprehensive metabolic panel     Status: Abnormal    Collection Time: 06/24/15 11:49 AM  Result Value Ref Range   Sodium 139 135 - 145 mmol/L   Potassium 3.7 3.5 - 5.1 mmol/L   Chloride 105 101 - 111 mmol/L   CO2 27 22 - 32 mmol/L   Glucose, Bld 84 65 - 99 mg/dL   BUN 9 6 - 20 mg/dL   Creatinine, Ser 0.82 0.44 - 1.00 mg/dL   Calcium 8.5 (L) 8.9 - 10.3 mg/dL   Total Protein 6.5 6.5 - 8.1 g/dL   Albumin 3.4 (L) 3.5 - 5.0 g/dL   AST 18 15 - 41 U/L   ALT 15 14 - 54 U/L   Alkaline Phosphatase 76 38 - 126 U/L   Total Bilirubin 0.6 0.3 - 1.2 mg/dL   GFR calc non Af Amer >60 >60 mL/min   GFR calc Af Amer >60 >60 mL/min    Comment: (NOTE) The eGFR has been calculated using the CKD EPI equation. This calculation has not been validated in all clinical situations. eGFR's persistently <60 mL/min signify possible Chronic Kidney Disease.    Anion gap 7 5 - 15  Urinalysis complete, with microscopic ([facility] only)     Status: Abnormal   Collection Time: 06/24/15 11:49 AM  Result Value Ref Range   Color, Urine YELLOW (A) YELLOW   APPearance CLEAR (A) CLEAR   Glucose, UA NEGATIVE NEGATIVE mg/dL   Bilirubin Urine NEGATIVE NEGATIVE   Ketones, ur NEGATIVE NEGATIVE mg/dL   Specific Gravity, Urine 1.011 1.005 - 1.030   Hgb urine dipstick 2+ (A) NEGATIVE   pH 7.0 5.0 - 8.0   Protein, ur NEGATIVE NEGATIVE mg/dL   Nitrite NEGATIVE NEGATIVE   Leukocytes, UA 1+ (A) NEGATIVE   RBC / HPF NONE SEEN 0 - 5 RBC/hpf   WBC, UA 0-5 0 - 5 WBC/hpf   Bacteria, UA RARE (A) NONE SEEN   Squamous Epithelial / LPF 0-5 (A) NONE SEEN   Mucous PRESENT   CBC with Differential/Platelet     Status: Abnormal   Collection Time: 06/24/15 11:49 AM  Result Value Ref Range   WBC 2.8 (L) 3.6 - 11.0 K/uL   RBC 4.23 3.80 - 5.20 MIL/uL   Hemoglobin 12.9 12.0 - 16.0 g/dL   HCT 39.3 35.0 - 47.0 %   MCV 93.0 80.0 - 100.0 fL   MCH 30.4 26.0 - 34.0 pg   MCHC 32.7 32.0 - 36.0 g/dL   RDW 13.6 11.5 - 14.5 %   Platelets 237 150 - 440 K/uL   Neutrophils Relative % 58 %   Neutro Abs 1.6  1.4 - 6.5 K/uL   Lymphocytes Relative 27 %   Lymphs Abs 0.8 (L) 1.0 - 3.6 K/uL   Monocytes Relative 7 %   Monocytes Absolute 0.2 0.2 - 0.9 K/uL   Eosinophils Relative 7 %   Eosinophils Absolute 0.2 0 - 0.7 K/uL   Basophils Relative 1 %   Basophils Absolute 0.0 0 - 0.1 K/uL  Uric acid     Status: None   Collection Time: 06/24/15 11:49 AM  Result Value Ref Range   Uric Acid, Serum 5.5 2.3 - 6.6 mg/dL  Lactate dehydrogenase     Status: None   Collection Time: 06/24/15 11:49 AM  Result Value Ref Range   LDH 160 98 - 192 U/L     Assessment & Plan  1. Thyroid nodule Refer back to endo  - Thyroid Panel With TSH  2. Benign essential HTN It may be secondary to pain, we will give her pain medication and follow up in four days, she still has clonidine at home and can take ir if remains above 150/90   3. History of iron deficiency  - CBC with Differential/Platelet  4. Vitamin D deficiency Need to resume replacement  5. Needs flu shot  - Flu Vaccine QUAD 36+ mos IM - refused 6. Hypocalcemia  - Basic Metabolic Panel (BMET)  7. Hypokalemia  - Basic Metabolic Panel (BMET) -magnesium  8. Myalgia It may be secondary to hypocalcemia, explained risk of nursing while taking medication for pain, stay hydrated, also advised to start otc calcium supplementation and potassium rich diet - traMADol (ULTRAM) 50 MG tablet; Take 1 tablet (50 mg total) by mouth every 8 (eight) hours as needed.  Dispense: 30 tablet; Refill: 0

## 2015-07-08 LAB — BASIC METABOLIC PANEL
BUN/Creatinine Ratio: 13 (ref 8–20)
BUN: 13 mg/dL (ref 6–20)
CALCIUM: 9.3 mg/dL (ref 8.7–10.2)
CHLORIDE: 99 mmol/L (ref 97–108)
CO2: 28 mmol/L (ref 18–29)
CREATININE: 1 mg/dL (ref 0.57–1.00)
GFR calc non Af Amer: 73 mL/min/{1.73_m2} (ref >59)
GFR, EST AFRICAN AMERICAN: 84 mL/min/{1.73_m2} (ref >59)
Glucose: 104 mg/dL — ABNORMAL HIGH (ref 65–99)
Potassium: 4.6 mmol/L (ref 3.5–5.2)
Sodium: 141 mmol/L (ref 134–144)

## 2015-07-08 LAB — CBC WITH DIFFERENTIAL/PLATELET
BASOS ABS: 0 10*3/uL (ref 0.0–0.2)
BASOS: 1 %
EOS (ABSOLUTE): 0.2 10*3/uL (ref 0.0–0.4)
Eos: 7 %
Hematocrit: 39.5 % (ref 34.0–46.6)
Hemoglobin: 12.9 g/dL (ref 11.1–15.9)
IMMATURE GRANS (ABS): 0 10*3/uL (ref 0.0–0.1)
Immature Granulocytes: 0 %
LYMPHS ABS: 1.3 10*3/uL (ref 0.7–3.1)
LYMPHS: 38 %
MCH: 29.6 pg (ref 26.6–33.0)
MCHC: 32.7 g/dL (ref 31.5–35.7)
MCV: 91 fL (ref 79–97)
Monocytes Absolute: 0.2 10*3/uL (ref 0.1–0.9)
Monocytes: 7 %
NEUTROS ABS: 1.6 10*3/uL (ref 1.4–7.0)
Neutrophils: 47 %
Platelets: 293 10*3/uL (ref 150–379)
RBC: 4.36 x10E6/uL (ref 3.77–5.28)
RDW: 13.7 % (ref 12.3–15.4)
WBC: 3.4 10*3/uL (ref 3.4–10.8)

## 2015-07-08 LAB — THYROID PANEL WITH TSH
FREE THYROXINE INDEX: 2.1 (ref 1.2–4.9)
T3 Uptake Ratio: 26 % (ref 24–39)
T4, Total: 8.2 ug/dL (ref 4.5–12.0)
TSH: 0.848 u[IU]/mL (ref 0.450–4.500)

## 2015-07-08 LAB — MAGNESIUM: Magnesium: 2.3 mg/dL (ref 1.6–2.3)

## 2015-07-08 NOTE — Progress Notes (Signed)
Patient notified

## 2015-07-11 ENCOUNTER — Ambulatory Visit: Payer: Medicaid Other | Admitting: Family Medicine

## 2015-12-07 ENCOUNTER — Other Ambulatory Visit: Payer: Self-pay

## 2015-12-07 DIAGNOSIS — M791 Myalgia, unspecified site: Secondary | ICD-10-CM

## 2015-12-07 NOTE — Telephone Encounter (Signed)
Patient states she has to check and see if she has full Medicaid or just family planning but she was in August 2016, and is now just running low on this one medication. Could you please send in one refill.

## 2015-12-08 NOTE — Telephone Encounter (Signed)
Patient notified and states she is unable to be seen due to insurance at the time being.

## 2015-12-08 NOTE — Telephone Encounter (Signed)
Will you check on patient Medicaid and see if she has family planning or full Medicaid. She told me yesterday she would call Social Services and I told her Dr. Carlynn Purl might not prescribe her the medication because it has been 5 months since she has been here last. But if she does have insurance if she could make a appointment if she does want the refill. Thanks.

## 2015-12-08 NOTE — Telephone Encounter (Signed)
The system says she has [employer]

## 2015-12-29 ENCOUNTER — Emergency Department
Admission: EM | Admit: 2015-12-29 | Discharge: 2015-12-29 | Disposition: A | Discharge status: Home / Self Care | Payer: Medicaid Other | Attending: Emergency Medicine | Admitting: Emergency Medicine

## 2015-12-29 ENCOUNTER — Emergency Department: Payer: Medicaid Other

## 2015-12-29 ENCOUNTER — Encounter: Payer: Self-pay | Admitting: Emergency Medicine

## 2015-12-29 DIAGNOSIS — S93402A Sprain of unspecified ligament of left ankle, initial encounter: Secondary | ICD-10-CM | POA: Insufficient documentation

## 2015-12-29 DIAGNOSIS — Y9302 Activity, running: Secondary | ICD-10-CM | POA: Insufficient documentation

## 2015-12-29 DIAGNOSIS — Y9289 Other specified places as the place of occurrence of the external cause: Secondary | ICD-10-CM | POA: Insufficient documentation

## 2015-12-29 DIAGNOSIS — I10 Essential (primary) hypertension: Secondary | ICD-10-CM | POA: Insufficient documentation

## 2015-12-29 DIAGNOSIS — X58XXXA Exposure to other specified factors, initial encounter: Secondary | ICD-10-CM | POA: Insufficient documentation

## 2015-12-29 DIAGNOSIS — Y998 Other external cause status: Secondary | ICD-10-CM | POA: Insufficient documentation

## 2015-12-29 MED ORDER — IBUPROFEN 800 MG PO TABS: 800.0000 mg | ORAL_TABLET | Freq: Three times a day (TID) | ORAL | Status: DC | PRN

## 2015-12-29 MED ORDER — IBUPROFEN 800 MG PO TABS
800.0000 mg | ORAL_TABLET | Freq: Once | ORAL | Status: AC
  Administered 2015-12-29: 800 mg via ORAL
  Filled 2015-12-29: qty 1

## 2015-12-29 MED ORDER — HYDROCHLOROTHIAZIDE 25 MG PO TABS: 25.0000 mg | ORAL_TABLET | Freq: Every day | ORAL | Status: DC

## 2015-12-29 NOTE — Discharge Instructions (Signed)
Ankle Sprain °An ankle sprain is an injury to the strong, fibrous tissues (ligaments) that hold the bones of your ankle joint together.  °CAUSES °An ankle sprain is usually caused by a fall or by twisting your ankle. Ankle sprains most commonly occur when you step on the outer edge of your foot, and your ankle turns inward. People who participate in sports are more prone to these types of injuries.  °SYMPTOMS  °· Pain in your ankle. The pain may be present at rest or only when you are trying to stand or walk. °· Swelling. °· Bruising. Bruising may develop immediately or within 1 to 2 days after your injury. °· Difficulty standing or walking, particularly when turning corners or changing directions. °DIAGNOSIS  °Your caregiver will ask you details about your injury and perform a physical exam of your ankle to determine if you have an ankle sprain. During the physical exam, your caregiver will press on and apply pressure to specific areas of your foot and ankle. Your caregiver will try to move your ankle in certain ways. An X-ray exam may be done to be sure a bone was not broken or a ligament did not separate from one of the bones in your ankle (avulsion fracture).  °TREATMENT  °Certain types of braces can help stabilize your ankle. Your caregiver can make a recommendation for this. Your caregiver may recommend the use of medicine for pain. If your sprain is severe, your caregiver may refer you to a surgeon who helps to restore function to parts of your skeletal system (orthopedist) or a physical therapist. °HOME CARE INSTRUCTIONS  °· Apply ice to your injury for 1-2 days or as directed by your caregiver. Applying ice helps to reduce inflammation and pain. °· Put ice in a plastic bag. °· Place a towel between your skin and the bag. °· Leave the ice on for 15-20 minutes at a time, every 2 hours while you are awake. °· Only take over-the-counter or prescription medicines for pain, discomfort, or fever as directed by  your caregiver. °· Elevate your injured ankle above the level of your heart as much as possible for 2-3 days. °· If your caregiver recommends crutches, use them as instructed. Gradually put weight on the affected ankle. Continue to use crutches or a cane until you can walk without feeling pain in your ankle. °· If you have a plaster splint, wear the splint as directed by your caregiver. Do not rest it on anything harder than a pillow for the first 24 hours. Do not put weight on it. Do not get it wet. You may take it off to take a shower or bath. °· You may have been given an elastic bandage to wear around your ankle to provide support. If the elastic bandage is too tight (you have numbness or tingling in your foot or your foot becomes cold and blue), adjust the bandage to make it comfortable. °· If you have an air splint, you may blow more air into it or let air out to make it more comfortable. You may take your splint off at night and before taking a shower or bath. Wiggle your toes in the splint several times per day to decrease swelling. °SEEK MEDICAL CARE IF:  °· You have rapidly increasing bruising or swelling. °· Your toes feel extremely cold or you lose feeling in your foot. °· Your pain is not relieved with medicine. °SEEK IMMEDIATE MEDICAL CARE IF: °· Your toes are numb or blue. °·   You have severe pain that is increasing. MAKE SURE YOU:   Understand these instructions.  Will watch your condition.  Will get help right away if you are not doing well or get worse.   This information is not intended to replace advice given to you by your health care provider. Make sure you discuss any questions you have with your health care provider.   Document Released: 11/05/2005 Document Revised: 11/26/2014 Document Reviewed: 11/17/2011 Elsevier Interactive Patient Education 2016 [employer].  Hypertension Hypertension is another name for high blood pressure. High blood pressure forces your heart to work  harder to pump blood. A blood pressure reading has two numbers, which includes a higher number over a lower number (example: 110/72). HOME CARE   Have your blood pressure rechecked by your doctor.  Only take medicine as told by your doctor. Follow the directions carefully. The medicine does not work as well if you skip doses. Skipping doses also puts you at risk for problems.  Do not smoke.  Monitor your blood pressure at home as told by your doctor. GET HELP IF:  You think you are having a reaction to the medicine you are taking.  You have repeat headaches or feel dizzy.  You have puffiness (swelling) in your ankles.  You have trouble with your vision. GET HELP RIGHT AWAY IF:   You get a very bad headache and are confused.  You feel weak, numb, or faint.  You get chest or belly (abdominal) pain.  You throw up (vomit).  You cannot breathe very well. MAKE SURE YOU:   Understand these instructions.  Will watch your condition.  Will get help right away if you are not doing well or get worse.   This information is not intended to replace advice given to you by your health care provider. Make sure you discuss any questions you have with your health care provider.   Document Released: 04/23/2008 Document Revised: 11/10/2013 Document Reviewed: 08/28/2013 Elsevier Interactive Patient Education [employer]

## 2015-12-29 NOTE — ED Notes (Signed)
Pt reports left ankle pain x1 week; sent here from [facility] due to HTN.

## 2015-12-29 NOTE — ED Provider Notes (Signed)
[facility] Emergency Department Provider Note     Time seen: ----------------------------------------- 7:42 PM on 12/29/2015 -----------------------------------------    I have reviewed the triage vital signs and the nursing notes.   HISTORY  Chief Complaint Ankle Pain and Hypertension    HPI Michelle Holder is a 35 y.o. female who presents ER for left ankle pain for the last week. Patient was running trying to catch her child and felt severe pain in the left ankle. Walking makes it worse. She was also noted to have high blood pressure was sent here for further evaluation. She denies any other complaints at this time.   Past Medical History  Diagnosis Date  . Hypertension   . Thyroid disease     thyroid nodule  . AMA (advanced maternal age) multigravida 35+   . Bariatric surgery status   . Obesity   . Thyroid nodule   . Vitamin D deficiency   . Dysmenorrhea   . Knee pain     right    Patient Active Problem List   Diagnosis Date Noted  . Dysmenorrhea 07/07/2015  . History of iron deficiency 07/07/2015  . Thyroid nodule 07/07/2015  . Vitamin D deficiency 07/07/2015  . Adult BMI 30+ 03/23/2010  . Benign essential HTN 02/20/2010    Past Surgical History  Procedure Laterality Date  . Cesarean section    . Laparoscopic gastrectomy      SILS    Allergies Review of patient's allergies indicates no known allergies.  Social History Social History  Substance Use Topics  . Smoking status: Never Smoker   . Smokeless tobacco: Never Used  . Alcohol Use: No    Review of Systems Constitutional: Negative for fever. Musculoskeletal: Positive for left ankle pain Skin: Negative for rash. Neurological: Negative for headaches, focal weakness or numbness.  ____________________________________________   PHYSICAL EXAM:  VITAL SIGNS: ED Triage Vitals  Enc Vitals Group     BP 12/29/15 1850 156/113 mmHg     Pulse Rate 12/29/15  1850 61     Resp 12/29/15 1850 16     Temp 12/29/15 1850 98 F (36.7 C)     Temp Source 12/29/15 1850 Oral     SpO2 12/29/15 1850 100 %     Weight 12/29/15 1850 245 lb (111.131 kg)     Height 12/29/15 1850  (1.753 m)     Head Cir --      Peak Flow --      Pain Score 12/29/15 1851 6     Pain Loc --      Pain Edu? --      Excl. in GC? --     Constitutional: Alert and oriented. Well appearing and in no distress. Musculoskeletal: Nontender with normal range of motion in all extremities. Subjective pain with range of motion of the left ankle. No swelling is noted. Neurologic:  Normal speech and language. No gross focal neurologic deficits are appreciated. Speech is normal. Gait is limited by pain Skin:  Skin is warm, dry and intact. No rash noted. ____________________________________________  ED COURSE:  Pertinent labs & imaging results that were available during my care of the patient were reviewed by me and considered in my medical decision making (see chart for details). Patient is in no acute distress, some of her hypertension is likely pain related. ____________________________________________    RADIOLOGY Images were viewed by me  IMPRESSION: No acute fracture or dislocation.  ____________________________________________  FINAL ASSESSMENT AND PLAN  Ankle sprain, hypertension  Plan: Patient with labs and imaging as dictated above. I will start her on HCTZ, encouraged Motrin and Ace wrap for her ankle. She is stable to follow up with her doctor in the next week.   Emily Filbert, MD   Emily Filbert, MD 12/29/15 [phone]

## 2016-03-31 ENCOUNTER — Encounter: Payer: Self-pay | Admitting: Emergency Medicine

## 2016-03-31 ENCOUNTER — Emergency Department: Payer: Medicaid Other

## 2016-03-31 ENCOUNTER — Emergency Department
Admission: EM | Admit: 2016-03-31 | Discharge: 2016-03-31 | Disposition: A | Discharge status: Home / Self Care | Payer: Medicaid Other | Attending: Emergency Medicine | Admitting: Emergency Medicine

## 2016-03-31 DIAGNOSIS — Z79899 Other long term (current) drug therapy: Secondary | ICD-10-CM | POA: Insufficient documentation

## 2016-03-31 DIAGNOSIS — I10 Essential (primary) hypertension: Secondary | ICD-10-CM | POA: Diagnosis not present

## 2016-03-31 DIAGNOSIS — E669 Obesity, unspecified: Secondary | ICD-10-CM | POA: Insufficient documentation

## 2016-03-31 DIAGNOSIS — R1031 Right lower quadrant pain: Secondary | ICD-10-CM | POA: Diagnosis present

## 2016-03-31 DIAGNOSIS — Z791 Long term (current) use of non-steroidal anti-inflammatories (NSAID): Secondary | ICD-10-CM | POA: Diagnosis not present

## 2016-03-31 DIAGNOSIS — E079 Disorder of thyroid, unspecified: Secondary | ICD-10-CM | POA: Diagnosis not present

## 2016-03-31 DIAGNOSIS — K5732 Diverticulitis of large intestine without perforation or abscess without bleeding: Secondary | ICD-10-CM | POA: Insufficient documentation

## 2016-03-31 LAB — LIPASE, BLOOD: Lipase: 18 U/L (ref 11–51)

## 2016-03-31 LAB — CBC
HEMATOCRIT: 39.5 % (ref 35.0–47.0)
HEMOGLOBIN: 13.2 g/dL (ref 12.0–16.0)
MCH: 29.6 pg (ref 26.0–34.0)
MCHC: 33.5 g/dL (ref 32.0–36.0)
MCV: 88.4 fL (ref 80.0–100.0)
Platelets: 199 10*3/uL (ref 150–440)
RBC: 4.47 MIL/uL (ref 3.80–5.20)
RDW: 13.1 % (ref 11.5–14.5)
WBC: 9.2 10*3/uL (ref 3.6–11.0)

## 2016-03-31 LAB — URINALYSIS COMPLETE WITH MICROSCOPIC (ARMC ONLY)
BACTERIA UA: NONE SEEN
Bilirubin Urine: NEGATIVE
Glucose, UA: NEGATIVE mg/dL
Ketones, ur: NEGATIVE mg/dL
Leukocytes, UA: NEGATIVE
Nitrite: NEGATIVE
PH: 7 (ref 5.0–8.0)
PROTEIN: NEGATIVE mg/dL
Specific Gravity, Urine: 1.012 (ref 1.005–1.030)

## 2016-03-31 LAB — COMPREHENSIVE METABOLIC PANEL
ALBUMIN: 4 g/dL (ref 3.5–5.0)
ALK PHOS: 54 U/L (ref 38–126)
ALT: 13 U/L — ABNORMAL LOW (ref 14–54)
ANION GAP: 7 (ref 5–15)
AST: 17 U/L (ref 15–41)
BUN: 10 mg/dL (ref 6–20)
CALCIUM: 9 mg/dL (ref 8.9–10.3)
CO2: 27 mmol/L (ref 22–32)
Chloride: 103 mmol/L (ref 101–111)
Creatinine, Ser: 0.99 mg/dL (ref 0.44–1.00)
GFR calc Af Amer: >60 mL/min (ref >60)
GFR calc non Af Amer: >60 mL/min (ref >60)
GLUCOSE: 144 mg/dL — AB (ref 65–99)
Potassium: 4.1 mmol/L (ref 3.5–5.1)
SODIUM: 137 mmol/L (ref 135–145)
Total Bilirubin: 1.3 mg/dL — ABNORMAL HIGH (ref 0.3–1.2)
Total Protein: 7.1 g/dL (ref 6.5–8.1)

## 2016-03-31 LAB — POCT PREGNANCY, URINE: Preg Test, Ur: NEGATIVE

## 2016-03-31 MED ORDER — SODIUM CHLORIDE 0.9 % IV BOLUS (SEPSIS)
1000.0000 mL | Freq: Once | INTRAVENOUS | Status: AC
  Administered 2016-03-31: 1000 mL via INTRAVENOUS

## 2016-03-31 MED ORDER — DIATRIZOATE MEGLUMINE & SODIUM 66-10 % PO SOLN
15.0000 mL | Freq: Once | ORAL | Status: AC
  Administered 2016-03-31: 15 mL via ORAL

## 2016-03-31 MED ORDER — CIPROFLOXACIN HCL 500 MG PO TABS: 500.0000 mg | ORAL_TABLET | Freq: Two times a day (BID) | ORAL | Status: DC

## 2016-03-31 MED ORDER — OXYCODONE-ACETAMINOPHEN 5-325 MG PO TABS: 1.0000 | ORAL_TABLET | Freq: Four times a day (QID) | ORAL | Status: DC | PRN

## 2016-03-31 MED ORDER — ONDANSETRON 4 MG PO TBDP: 4.0000 mg | ORAL_TABLET | Freq: Three times a day (TID) | ORAL | Status: DC | PRN

## 2016-03-31 MED ORDER — METRONIDAZOLE IN NACL 5-0.79 MG/ML-% IV SOLN
500.0000 mg | Freq: Once | INTRAVENOUS | Status: AC
  Administered 2016-03-31: 500 mg via INTRAVENOUS
  Filled 2016-03-31: qty 100

## 2016-03-31 MED ORDER — OXYCODONE-ACETAMINOPHEN 5-325 MG PO TABS
1.0000 | ORAL_TABLET | Freq: Once | ORAL | Status: AC
  Administered 2016-03-31: 1 via ORAL
  Filled 2016-03-31: qty 1

## 2016-03-31 MED ORDER — METRONIDAZOLE 500 MG PO TABS: 500.0000 mg | ORAL_TABLET | Freq: Two times a day (BID) | ORAL | Status: AC

## 2016-03-31 MED ORDER — FENTANYL CITRATE (PF) 100 MCG/2ML IJ SOLN
50.0000 ug | Freq: Once | INTRAMUSCULAR | Status: AC
  Administered 2016-03-31: 50 ug via INTRAVENOUS
  Filled 2016-03-31: qty 2

## 2016-03-31 MED ORDER — CIPROFLOXACIN IN D5W 400 MG/200ML IV SOLN
400.0000 mg | Freq: Once | INTRAVENOUS | Status: AC
  Administered 2016-03-31: 400 mg via INTRAVENOUS
  Filled 2016-03-31: qty 200

## 2016-03-31 MED ORDER — DOCUSATE SODIUM 100 MG PO CAPS: 100.0000 mg | ORAL_CAPSULE | Freq: Every day | ORAL | Status: DC | PRN

## 2016-03-31 MED ORDER — IOPAMIDOL (ISOVUE-300) INJECTION 61%
100.0000 mL | Freq: Once | INTRAVENOUS | Status: AC | PRN
  Administered 2016-03-31: 100 mL via INTRAVENOUS

## 2016-03-31 NOTE — ED Notes (Signed)
C/O abdominal pain. Onset Thursday evening, states had "muscle spasms in stomach" after working out.  Pain has persisted and also c/o chills and headache.

## 2016-03-31 NOTE — ED Provider Notes (Signed)
[facility] Emergency Department Provider Note  ____________________________________________   I have reviewed the triage vital signs and the nursing notes.   HISTORY  Chief Complaint Abdominal Pain    HPI Michelle Holder is a 35 y.o. female presents today complaining of abdominal pain presently on the left and right lower quadrant no vomiting, did have diarrhea a few days ago but that is gone. No felt that this was because she had pulled a muscle however the pain persisted pain has been there for a few days. Nothing makes it better nothing makes it worse. Sharp pain. Does believe that she has had some diverticulitis in the past. No history of dysuria and urinary frequency vaginal bleeding denies pregnancy. 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    Past Medical History  Diagnosis Date  . Hypertension   . Thyroid disease     thyroid nodule  . AMA (advanced maternal age) multigravida 35+   . Bariatric surgery status   . Obesity   . Thyroid nodule   . Vitamin D deficiency   . Dysmenorrhea   . Knee pain     right    Patient Active Problem List   Diagnosis Date Noted  . Dysmenorrhea 07/07/2015  . History of iron deficiency 07/07/2015  . Thyroid nodule 07/07/2015  . Vitamin D deficiency 07/07/2015  . Adult BMI 30+ 03/23/2010  . Benign essential HTN 02/20/2010    Past Surgical History  Procedure Laterality Date  . Cesarean section    . Laparoscopic gastrectomy      SILS    Current Outpatient Rx  Name  Route  Sig  Dispense  Refill  . Prenatal Vit-Fe Fumarate-FA (PRENATAL MULTIVITAMIN) TABS tablet   Oral   Take 1 tablet by mouth daily at 12 noon.   30 tablet   12   . ciprofloxacin (CIPRO) 500 MG tablet   Oral   Take 1 tablet (500 mg total) by mouth 2 (two) times daily.   28 tablet   0   . docusate sodium (COLACE) 100 MG capsule   Oral   Take 1 capsule (100 mg total) by mouth daily as needed.   30 capsule   2   .  hydrochlorothiazide (HYDRODIURIL) 25 MG tablet   Oral   Take 1 tablet (25 mg total) by mouth daily.   30 tablet   1   . ibuprofen (ADVIL,MOTRIN) 800 MG tablet   Oral   Take 1 tablet (800 mg total) by mouth every 8 (eight) hours as needed.   45 tablet   1   . ibuprofen (ADVIL,MOTRIN) 800 MG tablet   Oral   Take 1 tablet (800 mg total) by mouth every 8 (eight) hours as needed.   30 tablet   0   . metroNIDAZOLE (FLAGYL) 500 MG tablet   Oral   Take 1 tablet (500 mg total) by mouth 2 (two) times daily.   28 tablet   0   . ondansetron (ZOFRAN ODT) 4 MG disintegrating tablet   Oral   Take 1 tablet (4 mg total) by mouth every 8 (eight) hours as needed for nausea or vomiting.   20 tablet   0   . oxyCODONE-acetaminophen (ROXICET) 5-325 MG tablet   Oral   Take 1 tablet by mouth every 6 (six) hours as needed.   10 tablet   0   . traMADol (ULTRAM) 50 MG tablet   Oral   Take 1 tablet (50  mg total) by mouth every 8 (eight) hours as needed.   30 tablet   0     Allergies Review of patient's allergies indicates no known allergies.  Family History  Problem Relation Age of Onset  . Down syndrome Brother     Social History Social History  Substance Use Topics  . Smoking status: Never Smoker   . Smokeless tobacco: Never Used  . Alcohol Use: No    Review of Systems Constitutional: No fever/chills Eyes: No visual changes. ENT: No sore throat. No stiff neck no neck pain Cardiovascular: Denies chest pain. Respiratory: Denies shortness of breath. Gastrointestinal:   no vomiting.  NoOngoing diarrhea.  No constipation. Genitourinary: Negative for dysuria. Musculoskeletal: Negative lower extremity swelling Skin: Negative for rash. Neurological: Negative for headaches, focal weakness or numbness. 10-point ROS otherwise negative.  ____________________________________________   PHYSICAL EXAM:  VITAL SIGNS: ED Triage Vitals  Enc Vitals Group     BP 03/31/16 0800 132/81  mmHg     Pulse Rate 03/31/16 0800 78     Resp 03/31/16 0800 18     Temp 03/31/16 0800 98.6 F (37 C)     Temp Source 03/31/16 0800 Oral     SpO2 03/31/16 0800 98 %     Weight 03/31/16 0800 240 lb (108.863 kg)     Height 03/31/16 0800  (1.727 m)     Head Cir --      Peak Flow --      Pain Score 03/31/16 0802 3     Pain Loc --      Pain Edu? --      Excl. in GC? --     Constitutional: Alert and oriented. Well appearing and in no acute distress. Eyes: Conjunctivae are normal. PERRL. EOMI. Head: Atraumatic. Nose: No congestion/rhinnorhea. Mouth/Throat: Mucous membranes are moist.  Oropharynx non-erythematous. Neck: No stridor.   Nontender with no meningismus Cardiovascular: Normal rate, regular rhythm. Grossly normal heart sounds.  Good peripheral circulation. Respiratory: Normal respiratory effort.  No retractions. Lungs CTAB. Abdominal: Soft and mildly tender to palpation in the right lower quadrant as well as left. No distention. No guarding no rebound Back:  There is no focal tenderness or step off there is no midline tenderness there are no lesions noted. there is no CVA tenderness  Musculoskeletal: No lower extremity tenderness. No joint effusions, no DVT signs strong distal pulses no edema Neurologic:  Normal speech and language. No gross focal neurologic deficits are appreciated.  Skin:  Skin is warm, dry and intact. No rash noted. Psychiatric: Mood and affect are normal. Speech and behavior are normal.  ____________________________________________   LABS (all labs ordered are listed, but only abnormal results are displayed)  Labs Reviewed  COMPREHENSIVE METABOLIC PANEL - Abnormal; Notable for the following:    Glucose, Bld 144 (*)    ALT 13 (*)    Total Bilirubin 1.3 (*)    All other components within normal limits  URINALYSIS COMPLETEWITH MICROSCOPIC ([facility] ONLY) - Abnormal; Notable for the following:    Color, Urine YELLOW (*)    APPearance CLEAR (*)    Hgb  urine dipstick 2+ (*)    Squamous Epithelial / LPF 0-5 (*)    All other components within normal limits  LIPASE, BLOOD  CBC  POCT PREGNANCY, URINE   ____________________________________________  EKG  I personally interpreted any EKGs ordered by me or triage  ____________________________________________  RADIOLOGY  I reviewed any imaging ordered by me or triage that  were performed during my shift and, if possible, patient and/or family made aware of any abnormal findings. ____________________________________________   PROCEDURES  Procedure(s) performed: None  Critical Care performed: None  ____________________________________________   INITIAL IMPRESSION / ASSESSMENT AND PLAN / ED COURSE  Pertinent labs & imaging results that were available during my care of the patient were reviewed by me and considered in my medical decision making (see chart for details).  Patient with diverticulitis no evidence of perforation or abscess, patient was offered admission but would prefer to go home I do not think this is unreasonable. Outpatient follow-up is strongly advised. Return precautions given and understood. Cipro and Flagyl given in the IV form here. Blood work reassuring. ____________________________________________   FINAL CLINICAL IMPRESSION(S) / ED DIAGNOSES  Final diagnoses:  Diverticulitis of large intestine without perforation or abscess without bleeding      This chart was dictated using voice recognition software.  Despite best efforts to proofread,  errors can occur which can change meaning.     Jeanmarie PlantJames A McShane, MD 03/31/16 [phone]

## 2016-03-31 NOTE — Discharge Instructions (Signed)
Diverticulitis  Diverticulitis is when small pockets that have formed in your colon (large intestine) become infected or swollen.  HOME CARE  · Follow your doctor's instructions.  · Follow a special diet if told by your doctor.  · When you feel better, your doctor may tell you to change your diet. You may be told to eat a lot of fiber. Fruits and vegetables are good sources of fiber. Fiber makes it easier to poop (have bowel movements).  · Take supplements or probiotics as told by your doctor.  · Only take medicines as told by your doctor.  · Keep all follow-up visits with your doctor.  GET HELP IF:  · Your pain does not get better.  · You have a hard time eating food.  · You are not pooping like normal.  GET HELP RIGHT AWAY IF:  · Your pain gets worse.  · Your problems do not get better.  · Your problems suddenly get worse.  · You have a fever.  · You keep throwing up (vomiting).  · You have bloody or black, tarry poop (stool).  MAKE SURE YOU:   · Understand these instructions.  · Will watch your condition.  · Will get help right away if you are not doing well or get worse.     This information is not intended to replace advice given to you by your health care provider. Make sure you discuss any questions you have with your health care provider.     Document Released: 04/23/2008 Document Revised: 11/10/2013 Document Reviewed: 09/30/2013  Elsevier Interactive Patient Education ©2016 [employer]

## 2016-04-08 ENCOUNTER — Encounter: Payer: Self-pay | Admitting: Emergency Medicine

## 2016-04-08 ENCOUNTER — Inpatient Hospital Stay
Admission: EM | Admit: 2016-04-08 | Discharge: 2016-04-11 | DRG: 392 | Disposition: A | Discharge status: Home / Self Care | Payer: Medicaid Other | Attending: Internal Medicine | Admitting: Internal Medicine

## 2016-04-08 ENCOUNTER — Emergency Department: Payer: Medicaid Other

## 2016-04-08 DIAGNOSIS — Z79899 Other long term (current) drug therapy: Secondary | ICD-10-CM

## 2016-04-08 DIAGNOSIS — Z8279 Family history of other congenital malformations, deformations and chromosomal abnormalities: Secondary | ICD-10-CM

## 2016-04-08 DIAGNOSIS — Z9889 Other specified postprocedural states: Secondary | ICD-10-CM

## 2016-04-08 DIAGNOSIS — E041 Nontoxic single thyroid nodule: Secondary | ICD-10-CM | POA: Diagnosis present

## 2016-04-08 DIAGNOSIS — Z6836 Body mass index (BMI) 36.0-36.9, adult: Secondary | ICD-10-CM | POA: Diagnosis not present

## 2016-04-08 DIAGNOSIS — Z9884 Bariatric surgery status: Secondary | ICD-10-CM

## 2016-04-08 DIAGNOSIS — K5780 Diverticulitis of intestine, part unspecified, with perforation and abscess without bleeding: Secondary | ICD-10-CM | POA: Diagnosis present

## 2016-04-08 DIAGNOSIS — K5720 Diverticulitis of large intestine with perforation and abscess without bleeding: Secondary | ICD-10-CM | POA: Diagnosis not present

## 2016-04-08 DIAGNOSIS — I10 Essential (primary) hypertension: Secondary | ICD-10-CM | POA: Diagnosis present

## 2016-04-08 LAB — COMPREHENSIVE METABOLIC PANEL
ALBUMIN: 3.8 g/dL (ref 3.5–5.0)
ALT: 11 U/L — AB (ref 14–54)
AST: 12 U/L — AB (ref 15–41)
Alkaline Phosphatase: 59 U/L (ref 38–126)
Anion gap: 8 (ref 5–15)
BILIRUBIN TOTAL: 0.5 mg/dL (ref 0.3–1.2)
BUN: 9 mg/dL (ref 6–20)
CHLORIDE: 100 mmol/L — AB (ref 101–111)
CO2: 28 mmol/L (ref 22–32)
CREATININE: 0.9 mg/dL (ref 0.44–1.00)
Calcium: 9.2 mg/dL (ref 8.9–10.3)
GFR calc Af Amer: >60 mL/min (ref >60)
GLUCOSE: 76 mg/dL (ref 65–99)
Potassium: 4.2 mmol/L (ref 3.5–5.1)
Sodium: 136 mmol/L (ref 135–145)
TOTAL PROTEIN: 7.6 g/dL (ref 6.5–8.1)

## 2016-04-08 LAB — CBC
HCT: 38.4 % (ref 35.0–47.0)
Hemoglobin: 12.7 g/dL (ref 12.0–16.0)
MCH: 29 pg (ref 26.0–34.0)
MCHC: 33.1 g/dL (ref 32.0–36.0)
MCV: 87.6 fL (ref 80.0–100.0)
PLATELETS: 299 10*3/uL (ref 150–440)
RBC: 4.39 MIL/uL (ref 3.80–5.20)
RDW: 12.5 % (ref 11.5–14.5)
WBC: 10.4 10*3/uL (ref 3.6–11.0)

## 2016-04-08 LAB — URINALYSIS COMPLETE WITH MICROSCOPIC (ARMC ONLY)
BACTERIA UA: NONE SEEN
BILIRUBIN URINE: NEGATIVE
GLUCOSE, UA: NEGATIVE mg/dL
Ketones, ur: NEGATIVE mg/dL
LEUKOCYTES UA: NEGATIVE
Nitrite: NEGATIVE
Protein, ur: NEGATIVE mg/dL
Specific Gravity, Urine: 1.011 (ref 1.005–1.030)
WBC, UA: NONE SEEN WBC/hpf (ref 0–5)
pH: 7 (ref 5.0–8.0)

## 2016-04-08 LAB — LIPASE, BLOOD: Lipase: 14 U/L (ref 11–51)

## 2016-04-08 LAB — PREGNANCY, URINE: PREG TEST UR: NEGATIVE

## 2016-04-08 MED ORDER — HYDROCODONE-ACETAMINOPHEN 5-325 MG PO TABS
1.0000 | ORAL_TABLET | ORAL | Status: DC | PRN
  Administered 2016-04-08: 1 via ORAL
  Administered 2016-04-09 – 2016-04-11 (×6): 2 via ORAL
  Administered 2016-04-11: 1 via ORAL
  Filled 2016-04-08 (×3): qty 2
  Filled 2016-04-08: qty 1
  Filled 2016-04-08 (×2): qty 2
  Filled 2016-04-08: qty 1
  Filled 2016-04-08: qty 2

## 2016-04-08 MED ORDER — PIPERACILLIN-TAZOBACTAM 3.375 G IVPB 30 MIN
3.3750 g | Freq: Once | INTRAVENOUS | Status: AC
  Administered 2016-04-08: 3.375 g via INTRAVENOUS

## 2016-04-08 MED ORDER — DIATRIZOATE MEGLUMINE & SODIUM 66-10 % PO SOLN
15.0000 mL | Freq: Once | ORAL | Status: AC
  Administered 2016-04-08: 15 mL via ORAL

## 2016-04-08 MED ORDER — SODIUM CHLORIDE 0.9 % IV BOLUS (SEPSIS)
1000.0000 mL | Freq: Once | INTRAVENOUS | Status: AC
Start: 2016-04-08 — End: 2016-04-08
  Administered 2016-04-08: 1000 mL via INTRAVENOUS

## 2016-04-08 MED ORDER — PIPERACILLIN-TAZOBACTAM 3.375 G IVPB
INTRAVENOUS | Status: AC
  Filled 2016-04-08: qty 50

## 2016-04-08 MED ORDER — ALBUTEROL SULFATE (2.5 MG/3ML) 0.083% IN NEBU: 2.5000 mg | INHALATION_SOLUTION | RESPIRATORY_TRACT | Status: DC | PRN

## 2016-04-08 MED ORDER — PIPERACILLIN-TAZOBACTAM 3.375 G IVPB
3.3750 g | Freq: Three times a day (TID) | INTRAVENOUS | Status: DC
  Administered 2016-04-08 – 2016-04-11 (×8): 3.375 g via INTRAVENOUS
  Filled 2016-04-08 (×10): qty 50

## 2016-04-08 MED ORDER — ONDANSETRON HCL 4 MG PO TABS
4.0000 mg | ORAL_TABLET | Freq: Four times a day (QID) | ORAL | Status: DC | PRN
  Administered 2016-04-10 – 2016-04-11 (×2): 4 mg via ORAL
  Filled 2016-04-08 (×2): qty 1

## 2016-04-08 MED ORDER — ENOXAPARIN SODIUM 40 MG/0.4ML ~~LOC~~ SOLN
40.0000 mg | SUBCUTANEOUS | Status: DC
  Administered 2016-04-08 – 2016-04-10 (×3): 40 mg via SUBCUTANEOUS
  Filled 2016-04-08 (×2): qty 0.4

## 2016-04-08 MED ORDER — MORPHINE SULFATE (PF) 2 MG/ML IV SOLN
INTRAVENOUS | Status: AC
  Filled 2016-04-08: qty 1

## 2016-04-08 MED ORDER — MORPHINE SULFATE (PF) 4 MG/ML IV SOLN
4.0000 mg | Freq: Once | INTRAVENOUS | Status: AC
  Administered 2016-04-08: 4 mg via INTRAVENOUS

## 2016-04-08 MED ORDER — ACETAMINOPHEN 325 MG PO TABS: 650.0000 mg | ORAL_TABLET | Freq: Four times a day (QID) | ORAL | Status: DC | PRN

## 2016-04-08 MED ORDER — MORPHINE SULFATE (PF) 2 MG/ML IV SOLN
2.0000 mg | INTRAVENOUS | Status: DC | PRN
  Administered 2016-04-08 – 2016-04-09 (×5): 2 mg via INTRAVENOUS
  Filled 2016-04-08 (×5): qty 1

## 2016-04-08 MED ORDER — HYDROCHLOROTHIAZIDE 25 MG PO TABS
25.0000 mg | ORAL_TABLET | Freq: Every day | ORAL | Status: DC
  Filled 2016-04-08 (×2): qty 1

## 2016-04-08 MED ORDER — POTASSIUM CHLORIDE IN NACL 20-0.9 MEQ/L-% IV SOLN
INTRAVENOUS | Status: AC
  Administered 2016-04-08 – 2016-04-09 (×2): via INTRAVENOUS
  Filled 2016-04-08 (×2): qty 1000

## 2016-04-08 MED ORDER — IOPAMIDOL (ISOVUE-300) INJECTION 61%
100.0000 mL | Freq: Once | INTRAVENOUS | Status: AC | PRN
  Administered 2016-04-08: 100 mL via INTRAVENOUS
  Filled 2016-04-08: qty 100

## 2016-04-08 MED ORDER — ONDANSETRON HCL 4 MG/2ML IJ SOLN
4.0000 mg | Freq: Once | INTRAMUSCULAR | Status: AC
  Administered 2016-04-08: 4 mg via INTRAVENOUS
  Filled 2016-04-08: qty 2

## 2016-04-08 MED ORDER — MORPHINE SULFATE (PF) 4 MG/ML IV SOLN
4.0000 mg | Freq: Once | INTRAVENOUS | Status: AC
  Administered 2016-04-08: 4 mg via INTRAVENOUS
  Filled 2016-04-08: qty 1

## 2016-04-08 MED ORDER — ACETAMINOPHEN 650 MG RE SUPP: 650.0000 mg | Freq: Four times a day (QID) | RECTAL | Status: DC | PRN

## 2016-04-08 MED ORDER — MORPHINE SULFATE (PF) 4 MG/ML IV SOLN
INTRAVENOUS | Status: AC
  Filled 2016-04-08: qty 1

## 2016-04-08 MED ORDER — ONDANSETRON HCL 4 MG/2ML IJ SOLN
4.0000 mg | Freq: Four times a day (QID) | INTRAMUSCULAR | Status: DC | PRN
  Administered 2016-04-08 – 2016-04-11 (×2): 4 mg via INTRAVENOUS
  Filled 2016-04-08 (×2): qty 2

## 2016-04-08 NOTE — Progress Notes (Signed)
ANTIBIOTIC CONSULT NOTE - INITIAL  Pharmacy Consult for Zosyn  Indication: intra abdominal  No Known Allergies  Patient Measurements: Height: 5\' 8"  (172.7 cm) Weight: 240 lb (108.863 kg) IBW/kg (Calculated) : 63.9 Adjusted Body Weight:   Vital Signs: Temp: 98.7 F (37.1 C) (05/21 1207) Temp Source: Oral (05/21 1207) BP: 148/89 mmHg (05/21 1659) Pulse Rate: 83 (05/21 1659) Intake/Output from previous day:   Intake/Output from this shift:    Labs:  Recent Labs  04/08/16 1212  WBC 10.4  HGB 12.7  PLT 299  CREATININE 0.90   Estimated Creatinine Clearance: 111.7 mL/min (by C-G formula based on Cr of 0.9). No results for input(s): VANCOTROUGH, VANCOPEAK, VANCORANDOM, GENTTROUGH, GENTPEAK, GENTRANDOM, TOBRATROUGH, TOBRAPEAK, TOBRARND, AMIKACINPEAK, AMIKACINTROU, AMIKACIN in the last 72 hours.   Microbiology: No results found for this or any previous visit (from the past 720 hour(s)).  Medical History: Past Medical History  Diagnosis Date  . Hypertension   . Thyroid disease     thyroid nodule  . AMA (advanced maternal age) multigravida 35+   . Bariatric surgery status   . Obesity   . Thyroid nodule   . Vitamin D deficiency   . Dysmenorrhea   . Knee pain     right    Medications:  Scheduled:  . enoxaparin (LOVENOX) injection  40 mg Subcutaneous Q24H  . hydrochlorothiazide  25 mg Oral Daily  . morphine       Assessment: CrCl = 111.7 ml/min  Goal of Therapy:  resolution of infection  Plan:  Expected duration 7 days with resolution of temperature and/or normalization of WBC   Zosyn 3.375 gm IV X 1 given on 5/21 @ 17:00. Zosyn 3.375 gm IV Q8H EI ordered to start on 5/21 @ 23:00.   Jacqualin Shirkey D 04/08/2016,5:55 PM

## 2016-04-08 NOTE — ED Notes (Signed)
Seen in ED last Saturday for c/o abdominal pain, diagnosed with diverticulitis.  Friday, pain returned and pain continues to worsen.  Currently taking prescribed medications for diverticulitis.  Used last percocet last night for pain.

## 2016-04-08 NOTE — ED Provider Notes (Signed)
[facility] Emergency Department Provider Note    ____________________________________________  Time seen: ~1435  I have reviewed the triage vital signs and the nursing notes.   HISTORY  Chief Complaint Abdominal Pain   History limited by: Not Limited   HPI Michelle Holder is a 35 y.o. female read presents to the emergency department today because of concerns for recurrent and worsening abdominal pain. The pain is located in the left lower quadrant. The patient was seen roughly 1 week ago in the emergency department for similar pain. At that time she was diagnosed with diverticulitis. CT scan did not show any abscess or perforation. The patient started having pain again 2 days ago. She states the pain had been improving on the antibiotics and pain medication. 2 days ago however the pain became more severe. It has continued to be severe for the past 48 hours. It is worse with movements and with defecation. She has not noticed any blood with stooling. She has had some nausea but no vomiting. She feels like she has had fevers although has not had any measured temperature.    Past Medical History  Diagnosis Date  . Hypertension   . Thyroid disease     thyroid nodule  . AMA (advanced maternal age) multigravida 35+   . Bariatric surgery status   . Obesity   . Thyroid nodule   . Vitamin D deficiency   . Dysmenorrhea   . Knee pain     right    Patient Active Problem List   Diagnosis Date Noted  . Dysmenorrhea 07/07/2015  . History of iron deficiency 07/07/2015  . Thyroid nodule 07/07/2015  . Vitamin D deficiency 07/07/2015  . Adult BMI 30+ 03/23/2010  . Benign essential HTN 02/20/2010    Past Surgical History  Procedure Laterality Date  . Cesarean section    . Laparoscopic gastrectomy      SILS    Current Outpatient Rx  Name  Route  Sig  Dispense  Refill  . ciprofloxacin (CIPRO) 500 MG tablet   Oral   Take 1 tablet (500 mg total)  by mouth 2 (two) times daily.   28 tablet   0   . docusate sodium (COLACE) 100 MG capsule   Oral   Take 1 capsule (100 mg total) by mouth daily as needed.   30 capsule   2   . hydrochlorothiazide (HYDRODIURIL) 25 MG tablet   Oral   Take 1 tablet (25 mg total) by mouth daily.   30 tablet   1   . ibuprofen (ADVIL,MOTRIN) 800 MG tablet   Oral   Take 1 tablet (800 mg total) by mouth every 8 (eight) hours as needed.   45 tablet   1   . ibuprofen (ADVIL,MOTRIN) 800 MG tablet   Oral   Take 1 tablet (800 mg total) by mouth every 8 (eight) hours as needed.   30 tablet   0   . ondansetron (ZOFRAN ODT) 4 MG disintegrating tablet   Oral   Take 1 tablet (4 mg total) by mouth every 8 (eight) hours as needed for nausea or vomiting.   20 tablet   0   . oxyCODONE-acetaminophen (ROXICET) 5-325 MG tablet   Oral   Take 1 tablet by mouth every 6 (six) hours as needed.   10 tablet   0   . Prenatal Vit-Fe Fumarate-FA (PRENATAL MULTIVITAMIN) TABS tablet   Oral   Take 1 tablet by mouth daily at  12 noon.   30 tablet   12   . traMADol (ULTRAM) 50 MG tablet   Oral   Take 1 tablet (50 mg total) by mouth every 8 (eight) hours as needed.   30 tablet   0     Allergies Review of patient's allergies indicates no known allergies.  Family History  Problem Relation Age of Onset  . Down syndrome Brother     Social History Social History  Substance Use Topics  . Smoking status: Never Smoker   . Smokeless tobacco: Never Used  . Alcohol Use: No    Review of Systems  Constitutional: Positive for subjective fever Cardiovascular: Negative for chest pain. Respiratory: Negative for shortness of breath. Gastrointestinal: Positive for left lower quadrant pain Neurological: Negative for headaches, focal weakness or numbness.  10-point ROS otherwise negative.  ____________________________________________   PHYSICAL EXAM:  VITAL SIGNS: ED Triage Vitals  Enc Vitals Group     BP  04/08/16 1207 149/93 mmHg     Pulse Rate 04/08/16 1207 75     Resp 04/08/16 1207 18     Temp 04/08/16 1207 98.7 F (37.1 C)     Temp Source 04/08/16 1207 Oral     SpO2 04/08/16 1207 100 %     Weight 04/08/16 1207 240 lb (108.863 kg)     Height 04/08/16 1207 5\' 8"  (1.727 m)     Head Cir --      Peak Flow --      Pain Score 04/08/16 1209 8   Constitutional: Alert and oriented. Tearful. Appears uncomfortable. Eyes: Conjunctivae are normal. PERRL. Normal extraocular movements. ENT   Head: Normocephalic and atraumatic.   Nose: No congestion/rhinnorhea.   Mouth/Throat: Mucous membranes are moist.   Neck: No stridor. Hematological/Lymphatic/Immunilogical: No cervical lymphadenopathy. Cardiovascular: Normal rate, regular rhythm.  No murmurs, rubs, or gallops. Respiratory: Normal respiratory effort without tachypnea nor retractions. Breath sounds are clear and equal bilaterally. No wheezes/rales/rhonchi. Gastrointestinal: Soft and tender to palpation of the left lower quadrant. No distention. No rebound. No guarding. Genitourinary: Deferred Musculoskeletal: Normal range of motion in all extremities. No joint effusions.  No lower extremity tenderness nor edema. Neurologic:  Normal speech and language. No gross focal neurologic deficits are appreciated.  Skin:  Skin is warm, dry and intact. No rash noted. Psychiatric: Mood and affect are normal. Speech and behavior are normal. Patient exhibits appropriate insight and judgment.  ____________________________________________    LABS (pertinent positives/negatives)  Labs Reviewed  COMPREHENSIVE METABOLIC PANEL - Abnormal; Notable for the following:    Chloride 100 (*)    AST 12 (*)    ALT 11 (*)    All other components within normal limits  URINALYSIS COMPLETEWITH MICROSCOPIC ([facility] ONLY) - Abnormal; Notable for the following:    Color, Urine YELLOW (*)    APPearance CLEAR (*)    Hgb urine dipstick 1+ (*)    Squamous  Epithelial / LPF 0-5 (*)    All other components within normal limits  LIPASE, BLOOD  CBC  PREGNANCY, URINE     ____________________________________________   EKG  None  ____________________________________________    RADIOLOGY  CT abd/pel IMPRESSION: 1. Severe and intervally worsened focal colitis in the sigmoid colon, with a new abscess within or along the posterior sigmoid colon wall, and progressive and worsened mesenteric and omental stranding in this vicinity, with inflammatory stranding extending cephalad in the retroperitoneum, and with fluid in both paracolic gutters, in the subhepatic space, and complex fluid in the  cul-de-sac. No extraluminal gas is identified. Appearance favors severe diverticulitis or Crohn's colitis. The likelihood of an underlying colon tumor is low given the patient's age. 2. Cholelithiasis. 3. Chronic sacroiliitis, right greater than left. 4. Umbilical hernia contains adipose tissue. 5. 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  ____________________________________________   PROCEDURES  Procedure(s) performed: None  Critical Care performed: No  ____________________________________________   INITIAL IMPRESSION / ASSESSMENT AND PLAN / ED COURSE  Pertinent labs & imaging results that were available during my care of the patient were reviewed by me and considered in my medical decision making (see chart for details).  Patient presented to the emergency department today because of concerns for recurrent left lower quadrant pain in the setting of actively treated diverticulitis. On exam patient is tearful and appears quite comfortable. She is tender in the left lower quadrant. This point given that the pain has returned and severity of the patient's  pain. I do have concern for possible perforation or abscess. We will get a CT abdomen and pelvis to assess.  ----------------------------------------- 5:19 PM on 04/08/2016 -----------------------------------------  Patient CT scan did return and shows worsening colitis. There is now an abscess present. Because of this patient will be placed on IV antibiotics and admitted to the hospital service.   ____________________________________________   FINAL CLINICAL IMPRESSION(S) / ED DIAGNOSES  Final diagnoses:  Diverticulitis of large intestine with abscess without bleeding     Note: This dictation was prepared with Dragon dictation. Any transcriptional errors that result from this process are unintentional    Phineas Semen, MD 04/08/16 1720

## 2016-04-08 NOTE — H&P (Signed)
[facility] [place] at [facility]   PATIENT NAME: Michelle Holder    MR#:  [id number]  DATE OF BIRTH:  03/23/1981  DATE OF ADMISSION:  04/08/2016  PRIMARY CARE PHYSICIAN: Ruel FavorsKrichna F Sowles, MD   REQUESTING/REFERRING PHYSICIAN: Dr. Derrill KayGoodman  CHIEF COMPLAINT:   Chief Complaint  Patient presents with  . Abdominal Pain    HISTORY OF PRESENT ILLNESS:  Michelle Holder  is a 35 y.o. female with a known history of Hypertension presents to the emergency room due to worsening diverticulitis. Patient was seen in the emergency room one week back with left lower quadrant abdominal pain diagnosed with diverticulitis and discharged home on oral ciprofloxacin. She felt better for 1 or 2 days and then started worsening with increasing pain. She used a medications given in the emergency room and ibuprofen without any improvement. She has had nausea but no vomiting. Is able to tolerate food. A CT scan of the abdomen and pelvis was repeated here in the emergency room which shows developing abscess along with colitis and worsening diverticulitis. Patient is being admitted for IV antibiotics.  PAST MEDICAL HISTORY:   Past Medical History  Diagnosis Date  . Hypertension   . Thyroid disease     thyroid nodule  . AMA (advanced maternal age) multigravida 35+   . Bariatric surgery status   . Obesity   . Thyroid nodule   . Vitamin D deficiency   . Dysmenorrhea   . Knee pain     right    PAST SURGICAL HISTORY:   Past Surgical History  Procedure Laterality Date  . Cesarean section    . Laparoscopic gastrectomy      SILS    SOCIAL HISTORY:   Social History  Substance Use Topics  . Smoking status: Never Smoker   . Smokeless tobacco: Never Used  . Alcohol Use: No    FAMILY HISTORY:   Family History  Problem Relation Age of Onset  . Down syndrome Brother     DRUG ALLERGIES:  No Known Allergies  REVIEW OF SYSTEMS:   Review of Systems   Constitutional: Positive for malaise/fatigue. Negative for fever, chills and weight loss.  HENT: Negative for hearing loss and nosebleeds.   Eyes: Negative for blurred vision, double vision and pain.  Respiratory: Negative for cough, hemoptysis, sputum production, shortness of breath and wheezing.   Cardiovascular: Negative for chest pain, palpitations, orthopnea and leg swelling.  Gastrointestinal: Positive for nausea and abdominal pain. Negative for vomiting, diarrhea and constipation.  Genitourinary: Negative for dysuria and hematuria.  Musculoskeletal: Negative for myalgias, back pain and falls.  Skin: Negative for rash.  Neurological: Positive for weakness. Negative for dizziness, tremors, sensory change, speech change, focal weakness, seizures and headaches.  Endo/Heme/Allergies: Does not bruise/bleed easily.  Psychiatric/Behavioral: Negative for depression and memory loss. The patient is not nervous/anxious.     MEDICATIONS AT HOME:   Prior to Admission medications   Medication Sig Start Date End Date Taking? Authorizing Provider  ciprofloxacin (CIPRO) 500 MG tablet Take 1 tablet (500 mg total) by mouth 2 (two) times daily. 03/31/16 04/10/16 Yes Jeanmarie PlantJames A McShane, MD  docusate sodium (COLACE) 100 MG capsule Take 1 capsule (100 mg total) by mouth daily as needed. 03/31/16 03/31/17 Yes Jeanmarie PlantJames A McShane, MD  hydrochlorothiazide (HYDRODIURIL) 25 MG tablet Take 1 tablet (25 mg total) by mouth daily. 12/29/15  Yes Emily FilbertJonathan E Williams, MD  ibuprofen (ADVIL,MOTRIN) 800 MG tablet Take 1 tablet (800 mg total)  by mouth every 8 (eight) hours as needed. 06/11/15  Yes Jody Bovard-Stuckert, MD  ondansetron (ZOFRAN ODT) 4 MG disintegrating tablet Take 1 tablet (4 mg total) by mouth every 8 (eight) hours as needed for nausea or vomiting. 03/31/16  Yes Jeanmarie Plant, MD  oxyCODONE-acetaminophen (ROXICET) 5-325 MG tablet Take 1 tablet by mouth every 6 (six) hours as needed. 03/31/16  Yes Jeanmarie Plant, MD   Prenatal Vit-Fe Fumarate-FA (PRENATAL MULTIVITAMIN) TABS tablet Take 1 tablet by mouth daily at 12 noon. 06/11/15  Yes Jody Bovard-Stuckert, MD  traMADol (ULTRAM) 50 MG tablet Take 1 tablet (50 mg total) by mouth every 8 (eight) hours as needed. 07/07/15   Alba Cory, MD     VITAL SIGNS:  Blood pressure 148/89, pulse 83, temperature 98.7 F (37.1 C), temperature source Oral, resp. rate 18, height 5\' 8"  (1.727 m), weight 108.863 kg (240 lb), last menstrual period 03/13/2016, SpO2 100 %, currently breastfeeding.  PHYSICAL EXAMINATION:  Physical Exam  GENERAL:  35 y.o.-year-old patient lying in the bed with no acute distress. Obese EYES: Pupils equal, round, reactive to light and accommodation. No scleral icterus. Extraocular muscles intact.  HEENT: Head atraumatic, normocephalic. Oropharynx and nasopharynx clear. No oropharyngeal erythema, moist oral mucosa  NECK:  Supple, no jugular venous distention. No thyroid enlargement, no tenderness.  LUNGS: Normal breath sounds bilaterally, no wheezing, rales, rhonchi. No use of accessory muscles of respiration.  CARDIOVASCULAR: S1, S2 normal. No murmurs, rubs, or gallops.  ABDOMEN: Soft. No rigidity guarding. Tenderness in the lower abdomen more in the left lower quadrant area. EXTREMITIES: No pedal edema, cyanosis, or clubbing. + 2 pedal & radial pulses b/l.   NEUROLOGIC: Cranial nerves II through XII are intact. No focal Motor or sensory deficits appreciated b/l PSYCHIATRIC: The patient is alert and oriented x 3. Good affect.  SKIN: No obvious rash, lesion, or ulcer.   LABORATORY PANEL:   CBC  Recent Labs Lab 04/08/16 1212  WBC 10.4  HGB 12.7  HCT 38.4  PLT 299   ------------------------------------------------------------------------------------------------------------------  Chemistries   Recent Labs Lab 04/08/16 1212  NA 136  K 4.2  CL 100*  CO2 28  GLUCOSE 76  BUN 9  CREATININE 0.90  CALCIUM 9.2  AST 12*  ALT 11*   ALKPHOS 59  BILITOT 0.5   ------------------------------------------------------------------------------------------------------------------  Cardiac Enzymes No results for input(s): TROPONINI in the last 168 hours. ------------------------------------------------------------------------------------------------------------------  RADIOLOGY:  Ct Abdomen Pelvis W Contrast  04/08/2016  CLINICAL DATA:  Left lower quadrant abdominal pain. Recent acute diverticulitis. History of gastric sleeve and Cesarean section. EXAM: CT ABDOMEN AND PELVIS WITH CONTRAST TECHNIQUE: Multidetector CT imaging of the abdomen and pelvis was performed using the standard protocol following bolus administration of intravenous contrast. CONTRAST:  ISOVUE-300 IOPAMIDOL (ISOVUE-300) INJECTION 61% COMPARISON:  03/31/2016 FINDINGS: Lower chest:  Unremarkable Hepatobiliary: Multiple gallstones in the gallbladder, measuring up to 2.2 cm in long axis on image 26/2. Pancreas: Unremarkable Spleen: Unremarkable Adrenals/Urinary Tract: Unremarkable Stomach/Bowel: Prior gastric sleeve. Severe wall thickening in a 10 cm segment of the sigmoid colon shown on image 60/2, with a 2.1 by 3.3 by 2.5 cm fluid density within or along the posterior wall of this thickened bowel loop on image 60/to, and extensive surrounding omental and mesenteric edema which tracks cephalad in the retroperitoneum and along the left paracolic gutter were there is a small amount of fluid. Vascular/Lymphatic: Scattered small retroperitoneal lymph nodes are likely reactive. Retroperitoneal stranding confluent with the sigmoid inflammatory process. Reproductive:  Unremarkable Other: Small pocket of ascites in the right lower paracolic gutter. Small amount of ascites in the right subhepatic space. Small amount of complex ascites in the cul-de-sac. No extraluminal gas identified. Musculoskeletal: Degenerative findings in both hips. Chronic sacroiliitis, right greater than  left. Umbilical hernia contains adipose tissue. IMPRESSION: 1. Severe and intervally worsened focal colitis in the sigmoid colon, with a new abscess within or along the posterior sigmoid colon wall, and progressive and worsened mesenteric and omental stranding in this vicinity, with inflammatory stranding extending cephalad in the retroperitoneum, and with fluid in both paracolic gutters, in the subhepatic space, and complex fluid in the cul-de-sac. No extraluminal gas is identified. Appearance favors severe diverticulitis or Crohn's colitis. The likelihood of an underlying colon tumor is low given the patient's age. 2. Cholelithiasis. 3. Chronic sacroiliitis, right greater than left. 4. Umbilical hernia contains adipose tissue. 5. 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 Electronically Signed   By: Gaylyn Rong M.D.   On: 04/08/2016 16:50     IMPRESSION AND PLAN:   * Acute diverticulitis with abscess and colitis Start patient on IV Zosyn. She has failed treatment with oral ciprofloxacin. He has been tolerating regular food at home but has pain. We will keep her on clear liquids. Discussed with Dr. Orvis Brill of surgery. May need repeat CT scan and surgery if no improvement. IV fluids  * Hypertension Continue hydrochlorothiazide  * DVT prophylaxis with Lovenox  All the records are reviewed and case discussed with ED provider. Management plans discussed with the patient, family and they are in agreement.  CODE STATUS: FULL CODE  TOTAL TIME TAKING CARE OF THIS PATIENT: 40 minutes.   Milagros Loll R M.D on 04/08/2016 at 5:41 PM  Between 7am to 6pm - Pager - [phone]  After 6pm go to [email or web address] - password [email or web address] [facility]  [facility]  Office  [phone]  CC: Primary care physician;  Ruel Favors, MD  Note: This dictation was prepared with Dragon dictation along with smaller phrase technology. Any transcriptional errors that result from this process are unintentional.

## 2016-04-08 NOTE — Consult Note (Signed)
Michelle Holder is a 35 y.o. female  admitted with perforated diverticulitis.  HPI: She was in her usual state of good health until approximately 1 week ago when she developed left lower quadrant abdominal pain. She had some mild nausea without vomiting. She noted some fever at home. The pain persisted and she presented to the emergency room for further evaluation. CT scan at that point demonstrated some thickening around the sigmoid colon small amount of free fluid consistent with probable sigmoid diverticulitis. She was begun on oral antibiotics.  She was doing well until 48 hours ago when the pain suddenly became much worse. She again noted some fever at home. She did not have any significant chill. She was nauseated but did not vomit. The pain was intensely worse over the last 24 hours particularly with voiding and with defecation. She presented the emergency room for further evaluation. Repeat CT scan demonstrated a 3 cm fluid collection posterior and superior to the sigmoid colon and then a fluid collection in the pelvis consistent with possible abscess formation. No free air was noted.  She's been told in the past that she had diverticulitis which she related to a flax seed diet she was eating at that time. She's not had any problems recently until this current episode. She denies any history of hepatitis, yellow jaundice, pancreatitis, peptic ulcer disease, or gallbladder disease. She's had a previous gastric sleeve procedure for morbid obesity and has had a C-section. She denies any other significant medical problems.   Past Medical History  Diagnosis Date  . Hypertension   . Thyroid disease     thyroid nodule  . AMA (advanced maternal age) multigravida 35+   . Bariatric surgery status   . Obesity   . Thyroid nodule   . Vitamin D deficiency   . Dysmenorrhea   . Knee pain     right   Past Surgical History  Procedure Laterality Date  . Cesarean section    . Laparoscopic  gastrectomy      SILS   Social History   Social History  . Marital Status: Married    Spouse Name: N/A  . Number of Children: N/A  . Years of Education: N/A   Social History Main Topics  . Smoking status: Never Smoker   . Smokeless tobacco: Never Used  . Alcohol Use: No  . Drug Use: No  . Sexual Activity: Yes    Birth Control/ Protection: None   Other Topics Concern  . None   Social History Narrative    Review of Systems: Review of Systems  Constitutional: Positive for fever and weight loss. Negative for chills and malaise/fatigue.  HENT: Negative.   Eyes: Negative.   Respiratory: Negative for cough, shortness of breath and wheezing.   Cardiovascular: Negative for chest pain and leg swelling.  Gastrointestinal: Positive for nausea and abdominal pain. Negative for heartburn, vomiting, diarrhea and constipation.  Genitourinary: Positive for dysuria, urgency and frequency.  Musculoskeletal: Negative.   Skin: Negative.   Neurological: Negative.   Psychiatric/Behavioral: Negative.     PHYSICAL EXAM: BP 133/74 mmHg  Pulse 84  Temp(Src) 99 F (37.2 C) (Oral)  Resp 18  Ht  (1.727 m)  Wt 108.863 kg (240 lb)  BMI 36.50 kg/m2  SpO2 100%  LMP 03/13/2016  Physical Exam  Constitutional: She is oriented to person, place, and time. She appears well-developed and well-nourished. No distress.  HENT:  Head: Normocephalic and atraumatic.  Eyes: EOM are normal. Pupils  are equal, round, and reactive to light.  Neck: Normal range of motion. Neck supple.  Cardiovascular: Normal rate, regular rhythm and normal heart sounds.   Pulmonary/Chest: Effort normal and breath sounds normal.  Abdominal: Soft. Bowel sounds are normal. She exhibits no distension. There is tenderness. There is no rebound and no guarding.  Musculoskeletal: Normal range of motion. She exhibits no edema.  Neurological: She is alert and oriented to person, place, and time.  Skin: Skin is warm and dry. She is  not diaphoretic.  Psychiatric: She has a normal mood and affect. Her behavior is normal.   Her abdomen is moderately tender left lower quadrant with no mass noted. She has active bowel sounds. She does not have any significant guarding or rebound.  Impression/Plan: I independently reviewed both CT scans. There is no free air on the current examination but we would have to be concerned about possibility of a perforated diverticulitis. She certainly has what appears to be an abscess. I agree with current therapy with antibiotics. I would not consider surgical intervention at this time. We talked with her about possibility of surgical resection either electively or urgently both of which may require temporary colostomy. She is not interested in considering surgery at this time. We'll continue to follow her while she is hospitalized.   Tiney Rougealph Ely III, MD  04/08/2016, 8:50 PM

## 2016-04-08 NOTE — ED Notes (Signed)
AAOx3.  Skin warm and dry.  Posture relaxed.  NAD

## 2016-04-09 DIAGNOSIS — K5720 Diverticulitis of large intestine with perforation and abscess without bleeding: Principal | ICD-10-CM

## 2016-04-09 LAB — BASIC METABOLIC PANEL
ANION GAP: 5 (ref 5–15)
BUN: 8 mg/dL (ref 6–20)
CALCIUM: 8.5 mg/dL — AB (ref 8.9–10.3)
CO2: 27 mmol/L (ref 22–32)
Chloride: 103 mmol/L (ref 101–111)
Creatinine, Ser: 1.13 mg/dL — ABNORMAL HIGH (ref 0.44–1.00)
GLUCOSE: 68 mg/dL (ref 65–99)
Potassium: 4.2 mmol/L (ref 3.5–5.1)
Sodium: 135 mmol/L (ref 135–145)

## 2016-04-09 LAB — CBC
HCT: 36.8 % (ref 35.0–47.0)
HEMOGLOBIN: 12 g/dL (ref 12.0–16.0)
MCH: 28.8 pg (ref 26.0–34.0)
MCHC: 32.8 g/dL (ref 32.0–36.0)
MCV: 87.9 fL (ref 80.0–100.0)
PLATELETS: 286 10*3/uL (ref 150–440)
RBC: 4.18 MIL/uL (ref 3.80–5.20)
RDW: 12.8 % (ref 11.5–14.5)
WBC: 11.1 10*3/uL — ABNORMAL HIGH (ref 3.6–11.0)

## 2016-04-09 NOTE — Progress Notes (Addendum)
[facility] [place] [facility], [facility]                                                                                                                                                                                            Patient Demographics   Michelle DillonLatoya Castro Holder, is a 35 y.o. female, DOB - 02/11/1981, WUJ:[id number]  Admit date - 04/08/2016   Admitting Physician Milagros LollSrikar Sudini, MD  Outpatient Primary MD for the patient is Michelle FavorsKrichna F Sowles, MD   LOS - 1  Subjective:Patient   Continues to have abdominal pain. No nausea vomiting     Review of Systems:   CONSTITUTIONAL: No documented fever. No fatigue, weakness. No weight gain, no weight loss.  EYES: No blurry or double vision.  ENT: No tinnitus. No postnasal drip. No redness of the oropharynx.  RESPIRATORY: No cough, no wheeze, no hemoptysis. No dyspnea.  CARDIOVASCULAR: No chest pain. No orthopnea. No palpitations. No syncope.  GASTROINTESTINAL: No nausea, no vomiting or diarrhea. Positive abdominal pain. No melena or hematochezia.  GENITOURINARY: No dysuria or hematuria.  ENDOCRINE: No polyuria or nocturia. No heat or cold intolerance.  HEMATOLOGY: No anemia. No bruising. No bleeding.  INTEGUMENTARY: No rashes. No lesions.  MUSCULOSKELETAL: No arthritis. No swelling. No gout.  NEUROLOGIC: No numbness, tingling, or ataxia. No seizure-type activity.  PSYCHIATRIC: No anxiety. No insomnia. No ADD.    Vitals:   Filed Vitals:   04/08/16 1909 04/08/16 1951 04/09/16 0539 04/09/16 1012  BP: 156/105 133/74 119/58 131/75  Pulse: 82 84 86 64  Temp:  99 F (37.2 C) 98.3 F (36.8 C)   TempSrc:  Oral Oral   Resp: 18 18 18    Height:   5\' 8"  (1.727 m)   Weight:   109.725 kg (241 lb 14.4 oz)   SpO2: 100% 100% 99%     Wt Readings from Last 3 Encounters:  04/09/16 109.725 kg (241 lb 14.4 oz)  03/31/16 108.863 kg (240 lb)  12/29/15 111.131 kg (245 lb)     Intake/Output Summary (Last 24 hours) at  04/09/16 1346 Last data filed at 04/09/16 0748  Gross per 24 hour  Intake    240 ml  Output      0 ml  Net    240 ml    Physical Exam:   GENERAL: Pleasant-appearing in no apparent distress.  HEAD, EYES, EARS, NOSE AND THROAT: Atraumatic, normocephalic. Extraocular muscles are intact. Pupils equal and reactive to light. Sclerae anicteric. No conjunctival injection. No oro-pharyngeal erythema.  NECK: Supple. There is no jugular venous distention. No bruits, no lymphadenopathy, no thyromegaly.  HEART:  Regular rate and rhythm,. No murmurs, no rubs, no clicks.  LUNGS: Clear to auscultation bilaterally. No rales or rhonchi. No wheezes.  ABDOMEN: Soft, flat, tenderness in the left lower quadrant no guarding, nondistended. Has good bowel sounds. No hepatosplenomegaly appreciated.  EXTREMITIES: No evidence of any cyanosis, clubbing, or peripheral edema.  +2 pedal and radial pulses bilaterally.  NEUROLOGIC: The patient is alert, awake, and oriented x3 with no focal motor or sensory deficits appreciated bilaterally.  SKIN: Moist and warm with no rashes appreciated.  Psych: Not anxious, depressed LN: No inguinal LN enlargement    Antibiotics   Anti-infectives    Start     Dose/Rate Route Frequency Ordered Stop   04/08/16 2300  piperacillin-tazobactam (ZOSYN) IVPB 3.375 g     3.375 g 12.5 mL/hr over 240 Minutes Intravenous Every 8 hours 04/08/16 1753     04/08/16 1716  piperacillin-tazobactam (ZOSYN) 3.375 GM/50ML IVPB    Comments:  MAIN, KIMBERLY: cabinet override      04/08/16 1716 04/09/16 0529   04/08/16 1715  piperacillin-tazobactam (ZOSYN) IVPB 3.375 g     3.375 g 100 mL/hr over 30 Minutes Intravenous  Once 04/08/16 1702 04/08/16 1738      Medications   Scheduled Meds: . enoxaparin (LOVENOX) injection  40 mg Subcutaneous Q24H  . hydrochlorothiazide  25 mg Oral Daily  . piperacillin-tazobactam (ZOSYN)  IV  3.375 g Intravenous Q8H   Continuous Infusions: . 0.9 % NaCl with KCl 20  mEq / L 75 mL/hr at 04/09/16 0923   PRN Meds:.acetaminophen **OR** acetaminophen, albuterol, HYDROcodone-acetaminophen, morphine injection, ondansetron **OR** ondansetron (ZOFRAN) IV   Data Review:   Micro Results No results found for this or any previous visit (from the past 240 hour(s)).  Radiology Reports Ct Abdomen Pelvis W Contrast  04/08/2016  CLINICAL DATA:  Left lower quadrant abdominal pain. Recent acute diverticulitis. History of gastric sleeve and Cesarean section. EXAM: CT ABDOMEN AND PELVIS WITH CONTRAST TECHNIQUE: Multidetector CT imaging of the abdomen and pelvis was performed using the standard protocol following bolus administration of intravenous contrast. CONTRAST:  ISOVUE-300 IOPAMIDOL (ISOVUE-300) INJECTION 61% COMPARISON:  03/31/2016 FINDINGS: Lower chest:  Unremarkable Hepatobiliary: Multiple gallstones in the gallbladder, measuring up to 2.2 cm in long axis on image 26/2. Pancreas: Unremarkable Spleen: Unremarkable Adrenals/Urinary Tract: Unremarkable Stomach/Bowel: Prior gastric sleeve. Severe wall thickening in a 10 cm segment of the sigmoid colon shown on image 60/2, with a 2.1 by 3.3 by 2.5 cm fluid density within or along the posterior wall of this thickened bowel loop on image 60/to, and extensive surrounding omental and mesenteric edema which tracks cephalad in the retroperitoneum and along the left paracolic gutter were there is a small amount of fluid. Vascular/Lymphatic: Scattered small retroperitoneal lymph nodes are likely reactive. Retroperitoneal stranding confluent with the sigmoid inflammatory process. Reproductive: Unremarkable Other: Small pocket of ascites in the right lower paracolic gutter. Small amount of ascites in the right subhepatic space. Small amount of complex ascites in the cul-de-sac. No extraluminal gas identified. Musculoskeletal: Degenerative findings in both hips. Chronic sacroiliitis, right greater than left. Umbilical hernia contains  adipose tissue. IMPRESSION: 1. Severe and intervally worsened focal colitis in the sigmoid colon, with a new abscess within or along the posterior sigmoid colon wall, and progressive and worsened mesenteric and omental stranding in this vicinity, with inflammatory stranding extending cephalad in the retroperitoneum, and with fluid in both paracolic gutters, in the subhepatic space, and complex fluid in the cul-de-sac. No extraluminal gas is identified.  Appearance favors severe diverticulitis or Crohn's colitis. The likelihood of an underlying colon tumor is low given the patient's age. 2. Cholelithiasis. 3. Chronic sacroiliitis, right greater than left. 4. Umbilical hernia contains adipose tissue. 5. 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 Electronically Signed   By: Gaylyn Rong M.D.   On: 04/08/2016 16:50   Ct Abdomen Pelvis W Contrast  03/31/2016  CLINICAL DATA:  Abdominal pain 3 days. Muscle spasms over abdominal region after working out. Associated chills and headache. Gastric sleeve procedure 5 years ago. EXAM: CT ABDOMEN AND PELVIS WITH CONTRAST TECHNIQUE: Multidetector CT imaging of the abdomen and pelvis was performed using the standard protocol following bolus administration of intravenous contrast. CONTRAST:  ISOVUE-300 IOPAMIDOL (ISOVUE-300) INJECTION 61% COMPARISON:  None. FINDINGS: Lung bases are within normal. Abdominal images demonstrate a normal liver, spleen, pancreas and adrenal glands. There is moderate cholelithiasis. Kidneys normal size without hydronephrosis or nephrolithiasis. Vascular structures are within normal. Postsurgical changes compatible previous gastric bypass procedure. Appendix is normal. Small bowel is within normal. The colon demonstrates mild diverticulosis. There is mild  inflammatory change adjacent a short segment of the sigmoid colon in the left lower quadrant. There is no evidence of diverticular abscess or perforation. Findings are likely due to acute diverticulitis. Small umbilical/periumbilical hernia containing only peritoneal fat. Pelvic images demonstrate a normal uterus, ovaries, bladder and rectum. There is a small amount of free fluid in the cul-de-sac. There mild degenerate changes of the hips and sacroiliac joints. IMPRESSION: Evidence of acute diverticulitis of a short segment of the sigmoid colon in the left lower quadrant. No evidence of diverticular abscess or perforation. Moderate cholelithiasis. Small periumbilical/umbilical hernia containing only peritoneal fat. Small amount of free pelvic fluid. Evidence of previous gastric bypass procedure. Electronically Signed   By: Elberta Fortis M.D.   On: 03/31/2016 12:04     CBC  Recent Labs Lab 04/08/16 1212 04/09/16 0511  WBC 10.4 11.1*  HGB 12.7 12.0  HCT 38.4 36.8  PLT 299 286  MCV 87.6 87.9  MCH 29.0 28.8  MCHC 33.1 32.8  RDW 12.5 12.8    Chemistries   Recent Labs Lab 04/08/16 1212 04/09/16 0511  NA 136 135  K 4.2 4.2  CL 100* 103  CO2 28 27  GLUCOSE 76 68  BUN 9 8  CREATININE 0.90 1.13*  CALCIUM 9.2 8.5*  AST 12*  --   ALT 11*  --   ALKPHOS 59  --   BILITOT 0.5  --    ------------------------------------------------------------------------------------------------------------------ estimated creatinine clearance is 89.3 mL/min (by C-G formula based on Cr of 1.13). ------------------------------------------------------------------------------------------------------------------ No results for input(s): HGBA1C in the last 72 hours. ------------------------------------------------------------------------------------------------------------------ No results for input(s): CHOL, HDL, LDLCALC, TRIG, CHOLHDL, LDLDIRECT in the last 72  hours. ------------------------------------------------------------------------------------------------------------------ No results for input(s): TSH, T4TOTAL, T3FREE, THYROIDAB in the last 72 hours.  Invalid input(s): FREET3 ------------------------------------------------------------------------------------------------------------------ No results for input(s): VITAMINB12, FOLATE, FERRITIN, TIBC, IRON, RETICCTPCT in the last 72 hours.  Coagulation profile No results for input(s): INR, PROTIME in the last 168 hours.  No results for input(s): DDIMER in the last 72 hours.  Cardiac Enzymes No results for input(s): CKMB, TROPONINI, MYOGLOBIN in the last 168 hours.  Invalid input(s): CK ------------------------------------------------------------------------------------------------------------------ Invalid input(s): POCBNP    Assessment & Plan     * Acute diverticulitis with abscess and colitis Seen  by surgery patient wants to defer surgery for time being  continue IV zosyn Advance full liquid IV fluids  * Hypertension Continue hydrochlorothiazide  * DVT prophylaxis with Lovenox     Code Status Orders        Start     Ordered   04/08/16 1739  Full code   Continuous     04/08/16 1739    Code Status History    Date Active Date Inactive Code Status Order ID Comments User Context   06/09/2015  5:14 AM 06/11/2015  2:24 PM Full Code [id number]  Tracey Harries, MD Inpatient   06/08/2015  8:25 AM 06/09/2015  5:14 AM Full Code [id number]  Tracey Harries, MD Inpatient           Consults  surgery  DVT Prophylaxis  Lovenox   Lab Results  Component Value Date   PLT 286 04/09/2016     Time Spent in minutes    Greater than 50% of time spent in care coordination and counseling patient regarding the condition and plan of care.   Auburn Bilberry M.D on 04/09/2016 at 1:46 PM  Between 7am to 6pm - Pager - [phone]  After 6pm go to [email or web address] - password [email or web address]  [facility]  [facility]   Office  [phone]

## 2016-04-09 NOTE — Progress Notes (Signed)
CC: Diverticulitis Subjective: Patient reports feeling much better today than on admission. Denies any fevers, chills, nausea, vomiting, diarrhea, possibly. Her abdominal pain resolved completely resolved.  Objective: Vital signs in last 24 hours: Temp:  [98.1 F (36.7 C)-99 F (37.2 C)] 98.1 F (36.7 C) (05/22 1543) Pulse Rate:  [64-86] 76 (05/22 1543) Resp:  [18-20] 20 (05/22 1543) BP: (119-156)/(58-105) 133/87 mmHg (05/22 1543) SpO2:  [99 %-100 %] 99 % (05/22 1543) Weight:  [109.725 kg (241 lb 14.4 oz)] 109.725 kg (241 lb 14.4 oz) (05/22 0539)    Intake/Output from previous day:   Intake/Output this shift: Total I/O In: 240 [P.O.:240] Out: -   Physical exam:  Gen.: No acute distress Chest: Clear to auscultation Heart: Regular rhythm Abdomen: Soft, nontender, nondistended  Lab Results: CBC   Recent Labs  04/08/16 1212 04/09/16 0511  WBC 10.4 11.1*  HGB 12.7 12.0  HCT 38.4 36.8  PLT 299 286   BMET  Recent Labs  04/08/16 1212 04/09/16 0511  NA 136 135  K 4.2 4.2  CL 100* 103  CO2 28 27  GLUCOSE 76 68  BUN 9 8  CREATININE 0.90 1.13*  CALCIUM 9.2 8.5*   PT/INR No results for input(s): LABPROT, INR in the last 72 hours. ABG No results for input(s): PHART, HCO3 in the last 72 hours.  Invalid input(s): PCO2, PO2  Studies/Results: Ct Abdomen Pelvis W Contrast  04/08/2016  CLINICAL DATA:  Left lower quadrant abdominal pain. Recent acute diverticulitis. History of gastric sleeve and Cesarean section. EXAM: CT ABDOMEN AND PELVIS WITH CONTRAST TECHNIQUE: Multidetector CT imaging of the abdomen and pelvis was performed using the standard protocol following bolus administration of intravenous contrast. CONTRAST:  100mL ISOVUE-300 IOPAMIDOL (ISOVUE-300) INJECTION 61% COMPARISON:  03/31/2016 FINDINGS: Lower chest:  Unremarkable Hepatobiliary: Multiple gallstones in the gallbladder, measuring up to 2.2 cm in long axis on image 26/2. Pancreas: Unremarkable Spleen:  Unremarkable Adrenals/Urinary Tract: Unremarkable Stomach/Bowel: Prior gastric sleeve. Severe wall thickening in a 10 cm segment of the sigmoid colon shown on image 60/2, with a 2.1 by 3.3 by 2.5 cm fluid density within or along the posterior wall of this thickened bowel loop on image 60/to, and extensive surrounding omental and mesenteric edema which tracks cephalad in the retroperitoneum and along the left paracolic gutter were there is a small amount of fluid. Vascular/Lymphatic: Scattered small retroperitoneal lymph nodes are likely reactive. Retroperitoneal stranding confluent with the sigmoid inflammatory process. Reproductive: Unremarkable Other: Small pocket of ascites in the right lower paracolic gutter. Small amount of ascites in the right subhepatic space. Small amount of complex ascites in the cul-de-sac. No extraluminal gas identified. Musculoskeletal: Degenerative findings in both hips. Chronic sacroiliitis, right greater than left. Umbilical hernia contains adipose tissue. IMPRESSION: 1. Severe and intervally worsened focal colitis in the sigmoid colon, with a new abscess within or along the posterior sigmoid colon wall, and progressive and worsened mesenteric and omental stranding in this vicinity, with inflammatory stranding extending cephalad in the retroperitoneum, and with fluid in both paracolic gutters, in the subhepatic space, and complex fluid in the cul-de-sac. No extraluminal gas is identified. Appearance favors severe diverticulitis or Crohn's colitis. The likelihood of an underlying colon tumor is low given the patient's age. 2. Cholelithiasis. 3. Chronic sacroiliitis, right greater than left. 4. Umbilical hernia contains adipose tissue. 5. 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 Electronically Signed   By:  Gaylyn Rong M.D.   On: 04/08/2016 16:50    Anti-infectives: Anti-infectives    Start     Dose/Rate Route Frequency Ordered Stop   04/08/16 2300  piperacillin-tazobactam (ZOSYN) IVPB 3.375 g     3.375 g 12.5 mL/hr over 240 Minutes Intravenous Every 8 hours 04/08/16 1753     04/08/16 1716  piperacillin-tazobactam (ZOSYN) 3.375 GM/50ML IVPB    Comments:  MAIN, KIMBERLY: cabinet override      04/08/16 1716 04/09/16 0529   04/08/16 1715  piperacillin-tazobactam (ZOSYN) IVPB 3.375 g     3.375 g 100 mL/hr over 30 Minutes Intravenous  Once 04/08/16 1702 04/08/16 1738      Assessment/Plan:  35 year old female with diverticulitis. Appears to be responding to IV antibiotics. Patient desires to proceed with antibiotic therapy. Discussed with the patient that should she fail antibiotic therapy that surgical intervention would be warranted. Should antibiotic therapy be successful she will need surgical follow up in the next 2 weeks to discuss an outpatient colonoscopy and possibility of elective surgical intervention due to her perforated diverticulitis. Surgery will sign off, please call again if we can be of any further assistance.  Demarie Hyneman T. Tonita Cong, MD, FACS  04/09/2016

## 2016-04-10 NOTE — Clinical Social Work Note (Signed)
Clinical Social Work Assessment  Patient Details  Name: Michelle Holder MRN: [id number] Date of Birth: Aug 05, 1981  Date of referral:  04/09/2016               Reason for consult:  Family Concerns                Permission sought to share information with:  Other Permission granted to share information::  No   Housing/Transportation Living arrangements for the past 2 months:  Apartment Source of Information:  Patient Patient Interpreter Needed:  None Criminal Activity/Legal Involvement Pertinent to Current Situation/Hospitalization:  No - Comment as needed Significant Relationships:  Spouse, Dependent Children, Friend Lives with:  Minor Children, Spouse Do you feel safe going back to the place where you live?  Yes Need for family participation in patient care:  No (Coment)  Care giving concerns:  Pt's minor children (ages 62 years old and 62 months) were in the care of pt while pt was admitted.   Social Worker assessment / plan:  CSW was consulted as pt was providing care for minor children overnight while she was admitted. Pt is also on pain medication. CSW met with pt to address consult. CSW introduced herself and explained role of social work. CSW also explained the nature of consult. Pt shared that she did not have child care when she was admitted as her husband works overnight. Pt also stated that she was unaware that her children were not able to spend the night. CSW explained that the children are welcome to visit, however she was unable to provide primary care as she is a pt. Pt shared that she was still breast feeding. CSW offered a breast pump, which pt accepted. 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 CSW observed 76 year old daughter doing school work. Pt stated that she will make arrangements for child care.   CSW contacted the mother-baby unit for a breast pump, which was delivered to pt's room. CSW updated Scientific laboratory technician. CSW will continue to follow.   Employment status:  Other (Comment) Insurance information:  Self Pay (Medicaid Pending) PT Recommendations:  Not assessed at this time Information / Referral to community resources:  Other (Comment Required)  Patient/Family's Response to care:  Pt was accepting of CSW's assistance with breast pump.  Patient/Family's Understanding of and Emotional Response to Diagnosis, Current Treatment, and Prognosis:  Pt stated that she understands the policy and will make arrangements for child care this evening.   Emotional Assessment Appearance:  Appears stated age Attitude/Demeanor/Rapport:  Apprehensive Affect (typically observed):  Pleasant Orientation:  Oriented to Self, Oriented to Place, Oriented to  Time, Oriented to Situation Alcohol / Substance use:  Never Used Psych involvement (Current and /or in the community):  No (Comment)  Discharge Needs  Concerns to be addressed:  Adjustment to Illness, Other (Comment Required (child care while pt is in the hospital ) Readmission within the last 30 days:   No Current discharge risk:  Other, None Barriers to Discharge:  Continued Medical Work up   [employer], LCSW 04/10/2016, 9:33 AM

## 2016-04-10 NOTE — Progress Notes (Signed)
[facility] - [place] at [facility]                                                                                                                                                                                            Patient Demographics   Michelle Holder, is a 35 y.o. female, DOB - May 09, 1981, ZOX:[id number]  Admit date - 04/08/2016   Admitting Physician Milagros Loll, MD  Outpatient Primary MD for the patient is Ruel Favors, MD   LOS - 2  Subjective:Patient feeling better abdominal pain improved. No diarrhea     Review of Systems:   CONSTITUTIONAL: No documented fever. No fatigue, weakness. No weight gain, no weight loss.  EYES: No blurry or double vision.  ENT: No tinnitus. No postnasal drip. No redness of the oropharynx.  RESPIRATORY: No cough, no wheeze, no hemoptysis. No dyspnea.  CARDIOVASCULAR: No chest pain. No orthopnea. No palpitations. No syncope.  GASTROINTESTINAL: No nausea, no vomiting or diarrhea. Positive abdominal pain. No melena or hematochezia.  GENITOURINARY: No dysuria or hematuria.  ENDOCRINE: No polyuria or nocturia. No heat or cold intolerance.  HEMATOLOGY: No anemia. No bruising. No bleeding.  INTEGUMENTARY: No rashes. No lesions.  MUSCULOSKELETAL: No arthritis. No swelling. No gout.  NEUROLOGIC: No numbness, tingling, or ataxia. No seizure-type activity.  PSYCHIATRIC: No anxiety. No insomnia. No ADD.    Vitals:   Filed Vitals:   04/10/16 0500 04/10/16 0512 04/10/16 0736 04/10/16 1344  BP:  146/78 135/79 138/79  Pulse:  62 50 59  Temp:  97.6 F (36.4 C) 97.5 F (36.4 C) 97.7 F (36.5 C)  TempSrc:  Oral Oral Oral  Resp:  17 20   Height:      Weight: 109.725 kg (241 lb 14.4 oz)     SpO2:  100% 98% 100%    Wt Readings from Last 3 Encounters:  04/10/16 109.725 kg (241 lb 14.4 oz)  03/31/16 108.863 kg (240 lb)  12/29/15 111.131 kg (245 lb)     Intake/Output Summary (Last 24 hours) at 04/10/16  1405 Last data filed at 04/10/16 0900  Gross per 24 hour  Intake    480 ml  Output      0 ml  Net    480 ml    Physical Exam:   GENERAL: Pleasant-appearing in no apparent distress.  HEAD, EYES, EARS, NOSE AND THROAT: Atraumatic, normocephalic. Extraocular muscles are intact. Pupils equal and reactive to light. Sclerae anicteric. No conjunctival injection. No oro-pharyngeal erythema.  NECK: Supple. There is no jugular venous distention. No bruits, no lymphadenopathy, no thyromegaly.  HEART: Regular rate and  rhythm,. No murmurs, no rubs, no clicks.  LUNGS: Clear to auscultation bilaterally. No rales or rhonchi. No wheezes.  ABDOMEN: Soft, flat, tenderness in the left lower quadrant no guarding, nondistended. Has good bowel sounds. No hepatosplenomegaly appreciated.  EXTREMITIES: No evidence of any cyanosis, clubbing, or peripheral edema.  +2 pedal and radial pulses bilaterally.  NEUROLOGIC: The patient is alert, awake, and oriented x3 with no focal motor or sensory deficits appreciated bilaterally.  SKIN: Moist and warm with no rashes appreciated.  Psych: Not anxious, depressed LN: No inguinal LN enlargement    Antibiotics   Anti-infectives    Start     Dose/Rate Route Frequency Ordered Stop   04/08/16 2300  piperacillin-tazobactam (ZOSYN) IVPB 3.375 g     3.375 g 12.5 mL/hr over 240 Minutes Intravenous Every 8 hours 04/08/16 1753     04/08/16 1716  piperacillin-tazobactam (ZOSYN) 3.375 GM/50ML IVPB    Comments:  MAIN, KIMBERLY: cabinet override      04/08/16 1716 04/09/16 0529   04/08/16 1715  piperacillin-tazobactam (ZOSYN) IVPB 3.375 g     3.375 g 100 mL/hr over 30 Minutes Intravenous  Once 04/08/16 1702 04/08/16 1738      Medications   Scheduled Meds: . enoxaparin (LOVENOX) injection  40 mg Subcutaneous Q24H  . hydrochlorothiazide  25 mg Oral Daily  . piperacillin-tazobactam (ZOSYN)  IV  3.375 g Intravenous Q8H   Continuous Infusions:   PRN Meds:.acetaminophen **OR**  acetaminophen, albuterol, HYDROcodone-acetaminophen, morphine injection, ondansetron **OR** ondansetron (ZOFRAN) IV   Data Review:   Micro Results No results found for this or any previous visit (from the past 240 hour(s)).  Radiology Reports Ct Abdomen Pelvis W Contrast  04/08/2016  CLINICAL DATA:  Left lower quadrant abdominal pain. Recent acute diverticulitis. History of gastric sleeve and Cesarean section. EXAM: CT ABDOMEN AND PELVIS WITH CONTRAST TECHNIQUE: Multidetector CT imaging of the abdomen and pelvis was performed using the standard protocol following bolus administration of intravenous contrast. CONTRAST:  100mL ISOVUE-300 IOPAMIDOL (ISOVUE-300) INJECTION 61% COMPARISON:  03/31/2016 FINDINGS: Lower chest:  Unremarkable Hepatobiliary: Multiple gallstones in the gallbladder, measuring up to 2.2 cm in long axis on image 26/2. Pancreas: Unremarkable Spleen: Unremarkable Adrenals/Urinary Tract: Unremarkable Stomach/Bowel: Prior gastric sleeve. Severe wall thickening in a 10 cm segment of the sigmoid colon shown on image 60/2, with a 2.1 by 3.3 by 2.5 cm fluid density within or along the posterior wall of this thickened bowel loop on image 60/to, and extensive surrounding omental and mesenteric edema which tracks cephalad in the retroperitoneum and along the left paracolic gutter were there is a small amount of fluid. Vascular/Lymphatic: Scattered small retroperitoneal lymph nodes are likely reactive. Retroperitoneal stranding confluent with the sigmoid inflammatory process. Reproductive: Unremarkable Other: Small pocket of ascites in the right lower paracolic gutter. Small amount of ascites in the right subhepatic space. Small amount of complex ascites in the cul-de-sac. No extraluminal gas identified. Musculoskeletal: Degenerative findings in both hips. Chronic sacroiliitis, right greater than left. Umbilical hernia contains adipose tissue. IMPRESSION: 1. Severe and intervally worsened focal  colitis in the sigmoid colon, with a new abscess within or along the posterior sigmoid colon wall, and progressive and worsened mesenteric and omental stranding in this vicinity, with inflammatory stranding extending cephalad in the retroperitoneum, and with fluid in both paracolic gutters, in the subhepatic space, and complex fluid in the cul-de-sac. No extraluminal gas is identified. Appearance favors severe diverticulitis or Crohn's colitis. The likelihood of an underlying colon tumor is low given the  patient's age. 2. Cholelithiasis. 3. Chronic sacroiliitis, right greater than left. 4. Umbilical hernia contains adipose tissue. 5. 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 Electronically Signed   By: Gaylyn Rong M.D.   On: 04/08/2016 16:50   Ct Abdomen Pelvis W Contrast  03/31/2016  CLINICAL DATA:  Abdominal pain 3 days. Muscle spasms over abdominal region after working out. Associated chills and headache. Gastric sleeve procedure 5 years ago. EXAM: CT ABDOMEN AND PELVIS WITH CONTRAST TECHNIQUE: Multidetector CT imaging of the abdomen and pelvis was performed using the standard protocol following bolus administration of intravenous contrast. CONTRAST:  ISOVUE-300 IOPAMIDOL (ISOVUE-300) INJECTION 61% COMPARISON:  None. FINDINGS: Lung bases are within normal. Abdominal images demonstrate a normal liver, spleen, pancreas and adrenal glands. There is moderate cholelithiasis. Kidneys normal size without hydronephrosis or nephrolithiasis. Vascular structures are within normal. Postsurgical changes compatible previous gastric bypass procedure. Appendix is normal. Small bowel is within normal. The colon demonstrates mild diverticulosis. There is mild inflammatory change adjacent a short segment of the sigmoid colon in the left  lower quadrant. There is no evidence of diverticular abscess or perforation. Findings are likely due to acute diverticulitis. Small umbilical/periumbilical hernia containing only peritoneal fat. Pelvic images demonstrate a normal uterus, ovaries, bladder and rectum. There is a small amount of free fluid in the cul-de-sac. There mild degenerate changes of the hips and sacroiliac joints. IMPRESSION: Evidence of acute diverticulitis of a short segment of the sigmoid colon in the left lower quadrant. No evidence of diverticular abscess or perforation. Moderate cholelithiasis. Small periumbilical/umbilical hernia containing only peritoneal fat. Small amount of free pelvic fluid. Evidence of previous gastric bypass procedure. Electronically Signed   By: Elberta Fortis M.D.   On: 03/31/2016 12:04     CBC  Recent Labs Lab 04/08/16 1212 04/09/16 0511  WBC 10.4 11.1*  HGB 12.7 12.0  HCT 38.4 36.8  PLT 299 286  MCV 87.6 87.9  MCH 29.0 28.8  MCHC 33.1 32.8  RDW 12.5 12.8    Chemistries   Recent Labs Lab 04/08/16 1212 04/09/16 0511  NA 136 135  K 4.2 4.2  CL 100* 103  CO2 28 27  GLUCOSE 76 68  BUN 9 8  CREATININE 0.90 1.13*  CALCIUM 9.2 8.5*  AST 12*  --   ALT 11*  --   ALKPHOS 59  --   BILITOT 0.5  --    ------------------------------------------------------------------------------------------------------------------ estimated creatinine clearance is 89.3 mL/min (by C-G formula based on Cr of 1.13). ------------------------------------------------------------------------------------------------------------------ No results for input(s): HGBA1C in the last 72 hours. ------------------------------------------------------------------------------------------------------------------ No results for input(s): CHOL, HDL, LDLCALC, TRIG, CHOLHDL, LDLDIRECT in the last 72 hours. ------------------------------------------------------------------------------------------------------------------ No  results for input(s): TSH, T4TOTAL, T3FREE, THYROIDAB in the last 72 hours.  Invalid input(s): FREET3 ------------------------------------------------------------------------------------------------------------------ No results for input(s): VITAMINB12, FOLATE, FERRITIN, TIBC, IRON, RETICCTPCT in the last 72 hours.  Coagulation profile No results for input(s): INR, PROTIME in the last 168 hours.  No results for input(s): DDIMER in the last 72 hours.  Cardiac Enzymes No results for input(s): CKMB, TROPONINI, MYOGLOBIN in the last 168 hours.  Invalid input(s): CK ------------------------------------------------------------------------------------------------------------------ Invalid input(s): POCBNP    Assessment & Plan     * Acute diverticulitis with abscess and colitis Appreciate surgery input  continue IV zosyn 1 more day changed to Augmentin tomorrow Advance diet today IV fluids  *  Hypertension Continue hydrochlorothiazide Blood pressure normal  * DVT prophylaxis with Lovenox     Code Status Orders        Start     Ordered   04/08/16 1739  Full code   Continuous     04/08/16 1739    Code Status History    Date Active Date Inactive Code Status Order ID Comments User Context   06/09/2015  5:14 AM 06/11/2015  2:24 PM Full Code [id number]  Tracey Harries, MD Inpatient   06/08/2015  8:25 AM 06/09/2015  5:14 AM Full Code [id number]  Tracey Harries, MD Inpatient           Consults  surgery  DVT Prophylaxis  Lovenox   Lab Results  Component Value Date   PLT 286 04/09/2016     Time Spent in minutes    Greater than 50% of time spent in care coordination and counseling patient regarding the condition and plan of care.   Auburn Bilberry M.D on 04/10/2016 at 2:05 PM  Between 7am to 6pm - Pager - [phone]  After 6pm go to [email or web address] - password [email or web address] [facility]  [facility]   Office  [phone]

## 2016-04-11 ENCOUNTER — Telehealth: Payer: Self-pay | Admitting: Family Medicine

## 2016-04-11 MED ORDER — OXYCODONE-ACETAMINOPHEN 5-325 MG PO TABS: 1.0000 | ORAL_TABLET | Freq: Four times a day (QID) | ORAL | Status: DC | PRN

## 2016-04-11 MED ORDER — BISACODYL 10 MG RE SUPP
10.0000 mg | Freq: Once | RECTAL | Status: AC
  Administered 2016-04-11: 10 mg via RECTAL
  Filled 2016-04-11: qty 1

## 2016-04-11 MED ORDER — AMOXICILLIN-POT CLAVULANATE 875-125 MG PO TABS: 1.0000 | ORAL_TABLET | Freq: Two times a day (BID) | ORAL | Status: AC

## 2016-04-11 NOTE — Discharge Summary (Signed)
Michelle Holder, 35 y.o., DOB [birth year], MRN [id number]. Admission date: 04/08/2016 Discharge Date 04/11/2016 Primary MD Ruel Favors, MD Admitting Physician Milagros Loll, MD  Admission Diagnosis  Diverticulitis of large intestine with abscess without bleeding [K57.20]  Discharge Diagnosis   Active Problems:   Diverticulitis of intestine with abscess   Diverticulitis of large intestine with abscess without bleeding   Hypertension   Thyroid nodule    Morbid obesity        Hospital Course Michelle Holder is a 35 y.o. female with a known history of Hypertension presents to the emergency room due to worsening diverticulitis. Patient had a CT scan of the ER which showed diverticular abscess. She was seen by surgery who discussed surgical options patient opted for conservative therapy. She was treated with IV antibiotics subsequently switched to oral antibiotics. Patient was followed by surgery they recommended follow-up in 2 weeks in the office. If does not improve with antibiotics may need surgery. Patient's abdominal pain is mostly resolved. And she is feeling better tolerating diet stable for discharge home.             Consults  GI  Significant Tests:  See full reports for all details    Ct Abdomen Pelvis W Contrast  04/08/2016  CLINICAL DATA:  Left lower quadrant abdominal pain. Recent acute diverticulitis. History of gastric sleeve and Cesarean section. EXAM: CT ABDOMEN AND PELVIS WITH CONTRAST TECHNIQUE: Multidetector CT imaging of the abdomen and pelvis was performed using the standard protocol following bolus administration of intravenous contrast. CONTRAST:  ISOVUE-300 IOPAMIDOL (ISOVUE-300) INJECTION 61% COMPARISON:  03/31/2016 FINDINGS: Lower chest:  Unremarkable Hepatobiliary: Multiple gallstones in the gallbladder, measuring up to 2.2 cm in long axis on image 26/2. Pancreas: Unremarkable Spleen: Unremarkable Adrenals/Urinary Tract:  Unremarkable Stomach/Bowel: Prior gastric sleeve. Severe wall thickening in a 10 cm segment of the sigmoid colon shown on image 60/2, with a 2.1 by 3.3 by 2.5 cm fluid density within or along the posterior wall of this thickened bowel loop on image 60/to, and extensive surrounding omental and mesenteric edema which tracks cephalad in the retroperitoneum and along the left paracolic gutter were there is a small amount of fluid. Vascular/Lymphatic: Scattered small retroperitoneal lymph nodes are likely reactive. Retroperitoneal stranding confluent with the sigmoid inflammatory process. Reproductive: Unremarkable Other: Small pocket of ascites in the right lower paracolic gutter. Small amount of ascites in the right subhepatic space. Small amount of complex ascites in the cul-de-sac. No extraluminal gas identified. Musculoskeletal: Degenerative findings in both hips. Chronic sacroiliitis, right greater than left. Umbilical hernia contains adipose tissue. IMPRESSION: 1. Severe and intervally worsened focal colitis in the sigmoid colon, with a new abscess within or along the posterior sigmoid colon wall, and progressive and worsened mesenteric and omental stranding in this vicinity, with inflammatory stranding extending cephalad in the retroperitoneum, and with fluid in both paracolic gutters, in the subhepatic space, and complex fluid in the cul-de-sac. No extraluminal gas is identified. Appearance favors severe diverticulitis or Crohn's colitis. The likelihood of an underlying colon tumor is low given the patient's age. 2. Cholelithiasis. 3. Chronic sacroiliitis, right greater than left. 4. Umbilical hernia contains adipose tissue. 5. 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 Electronically Signed   By: Gaylyn Rong M.D.   On:  04/08/2016 16:50   Ct Abdomen Pelvis W  Contrast  03/31/2016  CLINICAL DATA:  Abdominal pain 3 days. Muscle spasms over abdominal region after working out. Associated chills and headache. Gastric sleeve procedure 5 years ago. EXAM: CT ABDOMEN AND PELVIS WITH CONTRAST TECHNIQUE: Multidetector CT imaging of the abdomen and pelvis was performed using the standard protocol following bolus administration of intravenous contrast. CONTRAST:  100mL ISOVUE-300 IOPAMIDOL (ISOVUE-300) INJECTION 61% COMPARISON:  None. FINDINGS: Lung bases are within normal. Abdominal images demonstrate a normal liver, spleen, pancreas and adrenal glands. There is moderate cholelithiasis. Kidneys normal size without hydronephrosis or nephrolithiasis. Vascular structures are within normal. Postsurgical changes compatible previous gastric bypass procedure. Appendix is normal. Small bowel is within normal. The colon demonstrates mild diverticulosis. There is mild inflammatory change adjacent a short segment of the sigmoid colon in the left lower quadrant. There is no evidence of diverticular abscess or perforation. Findings are likely due to acute diverticulitis. Small umbilical/periumbilical hernia containing only peritoneal fat. Pelvic images demonstrate a normal uterus, ovaries, bladder and rectum. There is a small amount of free fluid in the cul-de-sac. There mild degenerate changes of the hips and sacroiliac joints. IMPRESSION: Evidence of acute diverticulitis of a short segment of the sigmoid colon in the left lower quadrant. No evidence of diverticular abscess or perforation. Moderate cholelithiasis. Small periumbilical/umbilical hernia containing only peritoneal fat. Small amount of free pelvic fluid. Evidence of previous gastric bypass procedure. Electronically Signed   By: Elberta Fortisaniel  Boyle M.D.   On: 03/31/2016 12:04       Today   Subjective:   Michelle Holder  abdominal pain mostly resolved feeling much better   Objective:   Blood pressure 121/66, pulse 56, temperature 98.4 F (36.9 C), temperature source Oral, resp. rate 18, height 5\' 8"  (1.727 m), weight 110.995 kg (244 lb 11.2 oz), last menstrual period 03/13/2016, SpO2 100 %, currently breastfeeding.  .  Intake/Output Summary (Last 24 hours) at 04/11/16 1423 Last data filed at 04/11/16 1346  Gross per 24 hour  Intake    640 ml  Output      0 ml  Net    640 ml    Exam VITAL SIGNS: Blood pressure 121/66, pulse 56, temperature 98.4 F (36.9 C), temperature source Oral, resp. rate 18, height 5\' 8"  (1.727 m), weight 110.995 kg (244 lb 11.2 oz), last menstrual period 03/13/2016, SpO2 100 %, currently breastfeeding.  GENERAL:  35 y.o.-year-old patient lying in the bed with no acute distress.  EYES: Pupils equal, round, reactive to light and accommodation. No scleral icterus. Extraocular muscles intact.  HEENT: Head atraumatic, normocephalic. Oropharynx and nasopharynx clear.  NECK:  Supple, no jugular venous distention. No thyroid enlargement, no tenderness.  LUNGS: Normal breath sounds bilaterally, no wheezing, rales,rhonchi or crepitation. No use of accessory muscles of respiration.  CARDIOVASCULAR: S1, S2 normal. No murmurs, rubs, or gallops.  ABDOMEN: Soft, nontender, nondistended. Bowel sounds present. No organomegaly or mass.  EXTREMITIES: No pedal edema, cyanosis, or clubbing.  NEUROLOGIC: Cranial nerves II through XII are intact. Muscle strength 5/5 in all extremities. Sensation intact. Gait not checked.  PSYCHIATRIC: The patient is alert and oriented x 3.  SKIN: No obvious rash, lesion, or ulcer.   Data Review     CBC w Diff: Lab Results  Component Value Date   WBC 11.1* 04/09/2016   WBC 3.4 07/07/2015   WBC 5.7 10/23/2014   HGB 12.0 04/09/2016   HGB 12.0 10/23/2014   HCT 36.8 04/09/2016   HCT 39.5 07/07/2015   HCT 37.8  10/23/2014   PLT 286 04/09/2016   PLT 293 07/07/2015   PLT 226 10/23/2014   LYMPHOPCT 27 06/24/2015    LYMPHOPCT 23.6 10/23/2014   MONOPCT 7 06/24/2015   MONOPCT 8.4 10/23/2014   EOSPCT 7 06/24/2015   EOSPCT 2.5 10/23/2014   BASOPCT 1 06/24/2015   BASOPCT 1.0 10/23/2014   CMP: Lab Results  Component Value Date   NA 135 04/09/2016   NA 141 07/07/2015   NA 138 10/23/2014   K 4.2 04/09/2016   K 3.7 10/23/2014   CL 103 04/09/2016   CL 104 10/23/2014   CO2 27 04/09/2016   CO2 28 10/23/2014   BUN 8 04/09/2016   BUN 13 07/07/2015   BUN 10 10/23/2014   CREATININE 1.13* 04/09/2016   CREATININE 0.91 10/23/2014   PROT 7.6 04/08/2016   PROT 6.9 10/23/2014   ALBUMIN 3.8 04/08/2016   ALBUMIN 3.6 10/23/2014   BILITOT 0.5 04/08/2016   BILITOT 0.5 10/23/2014   ALKPHOS 59 04/08/2016   ALKPHOS 58 10/23/2014   AST 12* 04/08/2016   AST 20 10/23/2014   ALT 11* 04/08/2016   ALT 16 10/23/2014  .  Micro Results No results found for this or any previous visit (from the past 240 hour(s)).      Code Status Orders        Start     Ordered   04/08/16 1739  Full code   Continuous     04/08/16 1739    Code Status History    Date Active Date Inactive Code Status Order ID Comments User Context   06/09/2015  5:14 AM 06/11/2015  2:24 PM Full Code [id number]  Tracey Harries, MD Inpatient   06/08/2015  8:25 AM 06/09/2015  5:14 AM Full Code [id number]  Tracey Harries, MD Inpatient          Follow-up Information    Follow up with Ricarda Frame, MD. Go on 04/30/2016.   Specialty:  General Surgery   Why:  @ 1:45 PM Please Arrive Early    Contact information:   [address] [place] [address] [phone]       Follow up with Ruel Favors, MD. Go on 04/17/2016.   Specialty:  Family Medicine   Why:  :40 AM PLEASE ARRIVE 15 MIN EARLY   Contact information:   [address] [place] [address] [phone]       Discharge Medications     Medication List    STOP taking these medications        ciprofloxacin 500 MG tablet  Commonly known as:   CIPRO     hydrochlorothiazide 25 MG tablet  Commonly known as:  HYDRODIURIL      TAKE these medications        amoxicillin-clavulanate 875-125 MG tablet  Commonly known as:  AUGMENTIN  Take 1 tablet by mouth 2 (two) times daily.     docusate sodium 100 MG capsule  Commonly known as:  COLACE  Take 1 capsule (100 mg total) by mouth daily as needed.     ibuprofen 800 MG tablet  Commonly known as:  ADVIL,MOTRIN  Take 1 tablet (800 mg total) by mouth every 8 (eight) hours as needed.     ondansetron 4 MG disintegrating tablet  Commonly known as:  ZOFRAN ODT  Take 1 tablet (4 mg total) by mouth every 8 (eight) hours as needed for nausea or vomiting.     oxyCODONE-acetaminophen 5-325 MG tablet  Commonly known as:  ROXICET  Take 1 tablet by mouth every 6 (six) hours as needed.     prenatal multivitamin Tabs tablet  Take 1 tablet by mouth daily at 12 noon.     traMADol 50 MG tablet  Commonly known as:  ULTRAM  Take 1 tablet (50 mg total) by mouth every 8 (eight) hours as needed.           Total Time in preparing paper work, data evaluation and todays exam - 35 minutes  Auburn Bilberry M.D on 04/11/2016 at 2:23 [facility]  [facility]   Office  [phone]

## 2016-04-11 NOTE — Telephone Encounter (Signed)
[facility] scheduled hospital follow up for 04-17-16. Was in the hospital for diverticulitis

## 2016-04-11 NOTE — Discharge Instructions (Signed)
°  DIET:  Low fat diet  DISCHARGE CONDITION:  Stable  ACTIVITY:  Activity as tolerated  OXYGEN:  Home Oxygen: Yes.     Oxygen Delivery: room air  DISCHARGE LOCATION:  home    ADDITIONAL DISCHARGE INSTRUCTION:   If you experience worsening of your admission symptoms, develop shortness of breath, life threatening emergency, suicidal or homicidal thoughts you must seek medical attention immediately by calling 911 or calling your MD immediately  if symptoms less severe.  You Must read complete instructions/literature along with all the possible adverse reactions/side effects for all the Medicines you take and that have been prescribed to you. Take any new Medicines after you have completely understood and accpet all the possible adverse reactions/side effects.   Please note  You were cared for by a hospitalist during your hospital stay. If you have any questions about your discharge medications or the care you received while you were in the hospital after you are discharged, you can call the unit and asked to speak with the hospitalist on call if the hospitalist that took care of you is not available. Once you are discharged, your primary care physician will handle any further medical issues. Please note that NO REFILLS for any discharge medications will be authorized once you are discharged, as it is imperative that you return to your primary care physician (or establish a relationship with a primary care physician if you do not have one) for your aftercare needs so that they can reassess your need for medications and monitor your lab values.

## 2016-04-11 NOTE — Progress Notes (Signed)
[facility] [place] [facility]        Stephaie Azucena FallenCastro Gallimore was admitted to the Hospital on 04/08/2016 and Discharged  04/11/2016 and should be excused from work/school   for 7  days starting 04/08/2016 , may return to work/school without any restrictions.  Call Auburn BilberryShreyang Tanee Henery MD with questions.  Auburn BilberryPATEL, Annalia Metzger M.D on 04/11/2016,at 11:23 AM  [facility] [place] [facility]    Office  [phone]

## 2016-04-11 NOTE — Progress Notes (Signed)
Alert and oriented. Vital signs stable . No signs of acute distress. Discharge instructions given. Patient verbalizes understanding. No other issues noted at this time.   

## 2016-04-12 NOTE — Telephone Encounter (Signed)
Please ask how she is doing? Taking antibiotics?

## 2016-04-12 NOTE — Telephone Encounter (Signed)
Patient states she has no insurance due to losing her pregnancy medicaid so unsure if she will be able to make it to her follow up with Dr. Carlynn PurlSowles. The hospital financial aid is trying to help her obtain Medicaid right now Boneta LucksJenny [phone]. But Patient states she is not having any further complications since leaving the hospital and she feels better with the Augmentin given at the hospital as well. Patient is taking Augmentin 875-125 mg 1 tablet bid for 10 days.

## 2016-04-17 ENCOUNTER — Ambulatory Visit: Payer: Medicaid Other | Admitting: Family Medicine

## 2016-04-30 ENCOUNTER — Encounter: Payer: Self-pay | Admitting: General Surgery

## 2016-04-30 ENCOUNTER — Other Ambulatory Visit: Payer: Self-pay

## 2016-04-30 ENCOUNTER — Ambulatory Visit (INDEPENDENT_AMBULATORY_CARE_PROVIDER_SITE_OTHER): Payer: Self-pay | Admitting: General Surgery

## 2016-04-30 VITALS — BP 146/87 | HR 81 | Temp 98.4°F | Ht 62.0 in | Wt 244.0 lb

## 2016-04-30 DIAGNOSIS — K5720 Diverticulitis of large intestine with perforation and abscess without bleeding: Secondary | ICD-10-CM

## 2016-04-30 NOTE — Progress Notes (Signed)
Outpatient Surgical Follow Up  04/30/2016  Michelle Holder is an 35 y.o. female.   Chief Complaint  Patient presents with  . Follow-up    Diverticulitis    HPI: [address] female returns to clinic for follow-up from recent hospitalization for sigmoid diverticulitis. Her diverticulitis resolved on antibiotics alone. This is the second time she's been prescribed antibiotics for presumed diverticulitis. This one was CT proven. She denies any current fevers, chills, nausea, vomiting, diarrhea, constipation, chest pain, short of breath. She is returned to her baseline health at this time.  Past Medical History  Diagnosis Date  . Hypertension   . Thyroid disease     thyroid nodule  . AMA (advanced maternal age) multigravida 35+   . Bariatric surgery status   . Obesity   . Thyroid nodule   . Vitamin D deficiency   . Dysmenorrhea   . Knee pain     right    Past Surgical History  Procedure Laterality Date  . Cesarean section    . Laparoscopic gastrectomy      SILS    Family History  Problem Relation Age of Onset  . Down syndrome Brother     Social History:  reports that she has never smoked. She has never used smokeless tobacco. She reports that she does not drink alcohol or use illicit drugs.  Allergies: No Known Allergies  Medications reviewed.    ROS A multipoint review of systems was completed. All pertinent positives and negatives are documented in the history of present illness and remainder are negative.   BP 146/87 mmHg  Pulse 81  Temp(Src) 98.4 F (36.9 C) (Oral)  Ht 5\' 2"  (1.575 m)  Wt 110.678 kg (244 lb)  BMI 44.62 kg/m2  LMP 04/19/2016  Physical Exam  Gen.: No acute distress Neck: Supple, nontender Lymph nodes: No evidence of cervical or clavicular lymphadenopathy Chest: Clear to auscultation Heart: Regular rate and rhythm Abdomen: Soft, nontender, nondistended   No results found for this or any previous visit (from the past 48  hour(s)). No results found.  Assessment/Plan:  1. Diverticulitis of large intestine with abscess without bleeding 35 year old female with recent hospitalization for diverticulitis. Discussed with her the standard treatment protocol of a post flare colonoscopy. Patient states she's never had a colonoscopy before. After discussing colonoscopy and potential elective sigmoid colon resection patient voiced an uneasiness with proceeding and an uncertainty of whether not she would want to proceed. Discussed with patient that as her body and her choice. It is my recommendation that she undergo a colonoscopy in approximately 6 weeks, patient states that she will think about it and call the clinic should she decide to proceed. She'll follow-up in clinic on an as-needed basis.     Ricarda Frameharles Connor Meacham, MD FACS General Surgeon  04/30/2016,3:04 PM

## 2016-04-30 NOTE — Patient Instructions (Signed)
Please give us a call once you decide on doing the colonoscopy.  Diverticulitis  Diverticulitis is inflammation or infection of small pouches in your colon that form when you have a condition called diverticulosis. The pouches in your colon are called diverticula. Your colon, or large intestine, is where water is absorbed and stool is formed. Complications of diverticulitis can include:  Bleeding.  Severe infection.  Severe pain.  Perforation of your colon.  Obstruction of your colon. CAUSES  Diverticulitis is caused by bacteria. Diverticulitis happens when stool becomes trapped in diverticula. This allows bacteria to grow in the diverticula, which can lead to inflammation and infection. RISK FACTORS People with diverticulosis are at risk for diverticulitis. Eating a diet that does not include enough fiber from fruits and vegetables may make diverticulitis more likely to develop. SYMPTOMS  Symptoms of diverticulitis may include:  Abdominal pain and tenderness. The pain is normally located on the left side of the abdomen, but may occur in other areas.  Fever and chills.  Bloating.  Cramping.  Nausea.  Vomiting.  Constipation.  Diarrhea.  Blood in your stool. DIAGNOSIS  Your health care provider will ask you about your medical history and do a physical exam. You may need to have tests done because many medical conditions can cause the same symptoms as diverticulitis. Tests may include:  Blood tests.  Urine tests.  Imaging tests of the abdomen, including X-rays and CT scans. When your condition is under control, your health care provider may recommend that you have a colonoscopy. A colonoscopy can show how severe your diverticula are and whether something else is causing your symptoms. TREATMENT  Most cases of diverticulitis are mild and can be treated at home. Treatment may include:  Taking over-the-counter pain medicines.  Following a clear liquid diet.  Taking  antibiotic medicines by mouth for 7-10 days. More severe cases may be treated at a hospital. Treatment may include:  Not eating or drinking.  Taking prescription pain medicine.  Receiving antibiotic medicines through an IV tube.  Receiving fluids and nutrition through an IV tube.  Surgery. HOME CARE INSTRUCTIONS   Follow your health care provider's instructions carefully.  Follow a full liquid diet or other diet as directed by your health care provider. After your symptoms improve, your health care provider may tell you to change your diet. He or she may recommend you eat a high-fiber diet. Fruits and vegetables are good sources of fiber. Fiber makes it easier to pass stool.  Take fiber supplements or probiotics as directed by your health care provider.  Only take medicines as directed by your health care provider.  Keep all your follow-up appointments. SEEK MEDICAL CARE IF:   Your pain does not improve.  You have a hard time eating food.  Your bowel movements do not return to normal. SEEK IMMEDIATE MEDICAL CARE IF:   Your pain becomes worse.  Your symptoms do not get better.  Your symptoms suddenly get worse.  You have a fever.  You have repeated vomiting.  You have bloody or black, tarry stools. MAKE SURE YOU:   Understand these instructions.  Will watch your condition.  Will get help right away if you are not doing well or get worse.   This information is not intended to replace advice given to you by your health care provider. Make sure you discuss any questions you have with your health care provider.   Document Released: 08/15/2005 Document Revised: 11/10/2013 Document Reviewed: 09/30/2013 Elsevier Interactive  Patient Education 2016 [employer].

## 2016-06-15 ENCOUNTER — Encounter: Payer: Self-pay | Admitting: Emergency Medicine

## 2016-06-15 ENCOUNTER — Emergency Department
Admission: EM | Admit: 2016-06-15 | Discharge: 2016-06-15 | Disposition: A | Discharge status: Home / Self Care | Payer: Medicaid Other | Attending: Emergency Medicine | Admitting: Emergency Medicine

## 2016-06-15 DIAGNOSIS — B9689 Other specified bacterial agents as the cause of diseases classified elsewhere: Secondary | ICD-10-CM | POA: Insufficient documentation

## 2016-06-15 DIAGNOSIS — Z3A01 Less than 8 weeks gestation of pregnancy: Secondary | ICD-10-CM | POA: Insufficient documentation

## 2016-06-15 DIAGNOSIS — K5900 Constipation, unspecified: Secondary | ICD-10-CM | POA: Insufficient documentation

## 2016-06-15 DIAGNOSIS — O210 Mild hyperemesis gravidarum: Secondary | ICD-10-CM | POA: Insufficient documentation

## 2016-06-15 DIAGNOSIS — O2341 Unspecified infection of urinary tract in pregnancy, first trimester: Secondary | ICD-10-CM | POA: Insufficient documentation

## 2016-06-15 DIAGNOSIS — I10 Essential (primary) hypertension: Secondary | ICD-10-CM | POA: Diagnosis not present

## 2016-06-15 DIAGNOSIS — O9989 Other specified diseases and conditions complicating pregnancy, childbirth and the puerperium: Secondary | ICD-10-CM

## 2016-06-15 DIAGNOSIS — R8271 Bacteriuria: Secondary | ICD-10-CM

## 2016-06-15 LAB — URINALYSIS COMPLETE WITH MICROSCOPIC (ARMC ONLY)
Bilirubin Urine: NEGATIVE
Glucose, UA: NEGATIVE mg/dL
Nitrite: NEGATIVE
PH: 5 (ref 5.0–8.0)
PROTEIN: 30 mg/dL — AB
Specific Gravity, Urine: 1.029 (ref 1.005–1.030)

## 2016-06-15 LAB — COMPREHENSIVE METABOLIC PANEL
ALBUMIN: 3.9 g/dL (ref 3.5–5.0)
ALT: 13 U/L — ABNORMAL LOW (ref 14–54)
ANION GAP: 8 (ref 5–15)
AST: 19 U/L (ref 15–41)
Alkaline Phosphatase: 59 U/L (ref 38–126)
BUN: 13 mg/dL (ref 6–20)
CO2: 23 mmol/L (ref 22–32)
Calcium: 9.1 mg/dL (ref 8.9–10.3)
Chloride: 104 mmol/L (ref 101–111)
Creatinine, Ser: 0.93 mg/dL (ref 0.44–1.00)
GFR calc Af Amer: >60 mL/min (ref >60)
GFR calc non Af Amer: >60 mL/min (ref >60)
GLUCOSE: 85 mg/dL (ref 65–99)
POTASSIUM: 4.5 mmol/L (ref 3.5–5.1)
SODIUM: 135 mmol/L (ref 135–145)
Total Bilirubin: 0.7 mg/dL (ref 0.3–1.2)
Total Protein: 7 g/dL (ref 6.5–8.1)

## 2016-06-15 LAB — CBC
HEMATOCRIT: 39.7 % (ref 35.0–47.0)
HEMOGLOBIN: 13.2 g/dL (ref 12.0–16.0)
MCH: 29.7 pg (ref 26.0–34.0)
MCHC: 33.2 g/dL (ref 32.0–36.0)
MCV: 89.5 fL (ref 80.0–100.0)
Platelets: 229 10*3/uL (ref 150–440)
RBC: 4.44 MIL/uL (ref 3.80–5.20)
RDW: 15 % — AB (ref 11.5–14.5)
WBC: 7.2 10*3/uL (ref 3.6–11.0)

## 2016-06-15 LAB — HCG, QUANTITATIVE, PREGNANCY: hCG, Beta Chain, Quant, S: 97217 m[IU]/mL — ABNORMAL HIGH

## 2016-06-15 LAB — LIPASE, BLOOD: Lipase: 18 U/L (ref 11–51)

## 2016-06-15 MED ORDER — ONDANSETRON HCL 4 MG/2ML IJ SOLN
4.0000 mg | Freq: Once | INTRAMUSCULAR | Status: DC
  Filled 2016-06-15: qty 2

## 2016-06-15 MED ORDER — CEPHALEXIN 500 MG PO CAPS
500.0000 mg | ORAL_CAPSULE | Freq: Once | ORAL | Status: AC
  Administered 2016-06-15: 500 mg via ORAL
  Filled 2016-06-15: qty 1

## 2016-06-15 MED ORDER — CEPHALEXIN 500 MG PO CAPS: 500.0000 mg | ORAL_CAPSULE | Freq: Two times a day (BID) | ORAL | 0 refills | Status: AC

## 2016-06-15 MED ORDER — NITROFURANTOIN MONOHYD MACRO 100 MG PO CAPS: 100.0000 mg | ORAL_CAPSULE | Freq: Once | ORAL | Status: DC

## 2016-06-15 MED ORDER — METOCLOPRAMIDE HCL 5 MG/ML IJ SOLN
10.0000 mg | Freq: Once | INTRAMUSCULAR | Status: AC
  Administered 2016-06-15: 10 mg via INTRAVENOUS
  Filled 2016-06-15: qty 2

## 2016-06-15 MED ORDER — SODIUM CHLORIDE 0.9 % IV BOLUS (SEPSIS)
1000.0000 mL | Freq: Once | INTRAVENOUS | Status: AC
  Administered 2016-06-15: 1000 mL via INTRAVENOUS

## 2016-06-15 MED ORDER — METOCLOPRAMIDE HCL 10 MG PO TABS: 10.0000 mg | ORAL_TABLET | Freq: Four times a day (QID) | ORAL | 0 refills | Status: DC | PRN

## 2016-06-15 NOTE — Discharge Instructions (Signed)
Stay hydrated.   Take reglan for nausea.   Take keflex twice daily for three daily since there is some bacteria in your urine.   See your OB doctor.   Continue prenatal vitamins.   Return to ER if you have vaginal bleeding, abdominal pain, vomiting, dehydration, fevers.

## 2016-06-15 NOTE — ED Notes (Signed)
Pt reports last full BM was 4 days ago. Pt reports having pellet-type BMs every day since. Pt is tender lower abdominal quadrants

## 2016-06-15 NOTE — ED Provider Notes (Signed)
[facility]-EMERGENCY DEPARTMENT Provider Note   CSN: [id number] Arrival date & time: 06/15/16  1733  First Provider Contact:  First MD Initiated Contact with Patient 06/15/16 1858        History   Chief Complaint Chief Complaint  Patient presents with  . Emesis    HPI Michelle Holder is a 35 y.o. female here with nausea. Nausea for the last 3 weeks or so. She had a positive pregnancy test about 2 weeks ago and is scheduled to see her OB doctor in a week. She had intermittent vomiting for the last several days. She denies any vaginal bleeding. She states that she is constipated and has very hard bowel movements last several days.  The history is provided by the patient.    Past Medical History:  Diagnosis Date  . AMA (advanced maternal age) multigravida 35+   . Bariatric surgery status   . Dysmenorrhea   . Hypertension   . Knee pain    right  . Obesity   . Thyroid disease    thyroid nodule  . Thyroid nodule   . Vitamin D deficiency     Patient Active Problem List   Diagnosis Date Noted  . Diverticulitis of intestine with abscess 04/08/2016  . Diverticulitis of large intestine with abscess without bleeding   . Dysmenorrhea 07/07/2015  . History of iron deficiency 07/07/2015  . Thyroid nodule 07/07/2015  . Vitamin D deficiency 07/07/2015  . Adult BMI 30+ 03/23/2010  . Benign essential HTN 02/20/2010    Past Surgical History:  Procedure Laterality Date  . CESAREAN SECTION    . LAPAROSCOPIC GASTRECTOMY     SILS    OB History    Gravida Para Term Preterm AB Living   SAB TAB Ectopic Multiple Live Births         0         Home Medications    Prior to Admission medications   Medication Sig Start Date End Date Taking? Authorizing Provider  Prenatal Vit-Fe Fumarate-FA (PRENATAL MULTIVITAMIN) TABS tablet Take 1 tablet by mouth daily at 12 noon. 06/11/15   Sherian Rein, MD  Probiotic CAPS Take 1 capsule by mouth 1 day or 1 dose.     Historical Provider, MD    Family History Family History  Problem Relation Age of Onset  . Down syndrome Brother     Social History Social History  Substance Use Topics  . Smoking status: Never Smoker  . Smokeless tobacco: Never Used  . Alcohol use No     Allergies   Review of patient's allergies indicates no known allergies.   Review of Systems Review of Systems  Gastrointestinal: Positive for constipation.  All other systems reviewed and are negative.    Physical Exam Updated Vital Signs BP (!) 140/100   Pulse 63   Temp 98.2 F (36.8 C) (Oral)   Resp 18   Ht  (1.727 m)   Wt 230 lb (104.3 kg)   SpO2 98%   BMI 34.97 kg/m   Physical Exam  Constitutional: She is oriented to person, place, and time. She appears well-developed and well-nourished.  HENT:  Head: Normocephalic.  MM slightly dry   Eyes: EOM are normal. Pupils are equal, round, and reactive to light.  Neck: Normal range of motion. Neck supple.  Cardiovascular: Normal rate and regular rhythm.   Pulmonary/Chest: Effort normal and breath sounds normal.  Abdominal: Soft. Bowel sounds  are normal.  + gravid uterus,   Musculoskeletal: Normal range of motion.  Neurological: She is alert and oriented to person, place, and time. She displays normal reflexes. No cranial nerve deficit. Coordination normal.  Skin: Skin is warm.  Psychiatric: She has a normal mood and affect.  Nursing note and vitals reviewed.    ED Treatments / Results  Labs (all labs ordered are listed, but only abnormal results are displayed) Labs Reviewed  COMPREHENSIVE METABOLIC PANEL - Abnormal; Notable for the following:       Result Value   ALT 13 (*)    All other components within normal limits  CBC - Abnormal; Notable for the following:    RDW 15.0 (*)    All other components within normal limits  URINALYSIS COMPLETEWITH MICROSCOPIC ([facility] ONLY) - Abnormal; Notable for the following:    Color, Urine YELLOW (*)     APPearance CLEAR (*)    Ketones, ur TRACE (*)    Hgb urine dipstick 2+ (*)    Protein, ur 30 (*)    Leukocytes, UA TRACE (*)    Bacteria, UA RARE (*)    Squamous Epithelial / LPF 0-5 (*)    All other components within normal limits  HCG, QUANTITATIVE, PREGNANCY - Abnormal; Notable for the following:    hCG, Beta Chain, Quant, S 97,217 (*)    All other components within normal limits  LIPASE, BLOOD    EKG  EKG Interpretation None       Radiology No results found.  Procedures Procedures (including critical care time)  EMERGENCY DEPARTMENT [place] PREGNANCY "Study: Limited Ultrasound of the Pelvis"  INDICATIONS:Pregnancy(required) Multiple views of the uterus and pelvic cavity are obtained with a multi-frequency probe.  APPROACH:Transabdominal   PERFORMED BY: Myself  IMAGES ARCHIVED?: Yes  LIMITATIONS: Body habitus  PREGNANCY FREE FLUID: None  PREGNANCY UTERUS FINDINGS:Uterus enlarged ADNEXAL FINDINGS:Left ovary not seen and Right ovary not seen  PREGNANCY FINDINGS: Intrauterine gestational sac noted, Yolk sac noted, Fetal pole present and Fetal heart activity seen  INTERPRETATION: Viable intrauterine pregnancy  GESTATIONAL AGE, ESTIMATE: 8 weeks  FETAL HEART RATE: 188  COMMENT(Estimate of Gestational Age):  8 weeks     Medications Ordered in ED Medications  sodium chloride 0.9 % bolus 1,000 mL (1,000 mLs Intravenous New Bag/Given 06/15/16 1910)  metoCLOPramide (REGLAN) injection 10 mg (10 mg Intravenous Given 06/15/16 1911)     Initial Impression / Assessment and Plan / ED Course  I have reviewed the triage vital signs and the nursing notes.  Pertinent labs & imaging results that were available during my care of the patient were reviewed by me and considered in my medical decision making (see chart for details).  Clinical Course   Michelle Holder Michelle Holder is a 35 y.o. female here with hyperemesis. Mildly dehydrated. Vitals stable. UCG positive at home.  Bedside US confirmed IUP about 8 weeks. Has OB appointment in a week. Will hydrate and check labs.  8:20 PM HCG 97,000. Chemistry unremarkable. UA with some leuks and bacteria but no nitrates. Given asymptomatic bacteriuria in pregnancy, will give keflex for several days. Given reglan, tolerated PO fluids. Will dc home with reglan, keflex. Has appointment with OB in a week.     Final Clinical Impressions(s) / ED Diagnoses   Final diagnoses:  None    New Prescriptions New Prescriptions   No medications on file     Charlynne Pander, MD 06/15/16 2023

## 2016-06-15 NOTE — ED Triage Notes (Signed)
Pt has been nauseated and vomiting 1-2 times per day. Pt is pregnant but not sure how far. Positive home pregnancy at home and health dept. Has had lower abdominal cramping.

## 2016-06-15 NOTE — ED Notes (Signed)
Pt reports she is pregnant - unsure of how far along. Pt reports N/V X 3 weeks. Pt reports she feels dehydrated. Pt c/o of abdominal cramping beginning today

## 2016-06-15 NOTE — ED Notes (Signed)
Reviewed d/c instructions, follow-up care, and prescription with pt. Pt verbalized understanding 

## 2016-06-17 LAB — URINE CULTURE

## 2016-06-22 ENCOUNTER — Emergency Department
Admission: EM | Admit: 2016-06-22 | Discharge: 2016-06-22 | Disposition: A | Discharge status: Home / Self Care | Payer: Medicaid Other | Attending: Emergency Medicine | Admitting: Emergency Medicine

## 2016-06-22 ENCOUNTER — Emergency Department: Payer: Medicaid Other

## 2016-06-22 DIAGNOSIS — Z3A08 8 weeks gestation of pregnancy: Secondary | ICD-10-CM | POA: Insufficient documentation

## 2016-06-22 DIAGNOSIS — O209 Hemorrhage in early pregnancy, unspecified: Secondary | ICD-10-CM | POA: Diagnosis present

## 2016-06-22 DIAGNOSIS — Z79899 Other long term (current) drug therapy: Secondary | ICD-10-CM | POA: Diagnosis not present

## 2016-06-22 DIAGNOSIS — I10 Essential (primary) hypertension: Secondary | ICD-10-CM | POA: Insufficient documentation

## 2016-06-22 DIAGNOSIS — E162 Hypoglycemia, unspecified: Secondary | ICD-10-CM | POA: Diagnosis not present

## 2016-06-22 DIAGNOSIS — N939 Abnormal uterine and vaginal bleeding, unspecified: Secondary | ICD-10-CM

## 2016-06-22 DIAGNOSIS — O200 Threatened abortion: Secondary | ICD-10-CM

## 2016-06-22 LAB — COMPREHENSIVE METABOLIC PANEL
ALK PHOS: 56 U/L (ref 38–126)
ALT: 13 U/L — ABNORMAL LOW (ref 14–54)
ANION GAP: 7 (ref 5–15)
AST: 18 U/L (ref 15–41)
Albumin: 3.6 g/dL (ref 3.5–5.0)
BILIRUBIN TOTAL: 0.5 mg/dL (ref 0.3–1.2)
BUN: 10 mg/dL (ref 6–20)
CALCIUM: 9.2 mg/dL (ref 8.9–10.3)
CO2: 26 mmol/L (ref 22–32)
Chloride: 104 mmol/L (ref 101–111)
Creatinine, Ser: 0.77 mg/dL (ref 0.44–1.00)
GLUCOSE: 63 mg/dL — AB (ref 65–99)
POTASSIUM: 4.1 mmol/L (ref 3.5–5.1)
Sodium: 137 mmol/L (ref 135–145)
TOTAL PROTEIN: 6.7 g/dL (ref 6.5–8.1)

## 2016-06-22 LAB — CBC WITH DIFFERENTIAL/PLATELET
BASOS PCT: 1 %
Basophils Absolute: 0 10*3/uL (ref 0–0.1)
Eosinophils Absolute: 0.1 10*3/uL (ref 0–0.7)
Eosinophils Relative: 1 %
HEMATOCRIT: 38.6 % (ref 35.0–47.0)
HEMOGLOBIN: 13.2 g/dL (ref 12.0–16.0)
LYMPHS ABS: 0.9 10*3/uL — AB (ref 1.0–3.6)
LYMPHS PCT: 21 %
MCH: 30.9 pg (ref 26.0–34.0)
MCHC: 34.3 g/dL (ref 32.0–36.0)
MCV: 89.9 fL (ref 80.0–100.0)
MONO ABS: 0.5 10*3/uL (ref 0.2–0.9)
MONOS PCT: 12 %
NEUTROS ABS: 2.8 10*3/uL (ref 1.4–6.5)
NEUTROS PCT: 65 %
Platelets: 244 10*3/uL (ref 150–440)
RBC: 4.29 MIL/uL (ref 3.80–5.20)
RDW: 14.3 % (ref 11.5–14.5)
WBC: 4.2 10*3/uL (ref 3.6–11.0)

## 2016-06-22 LAB — URINALYSIS COMPLETE WITH MICROSCOPIC (ARMC ONLY)
BILIRUBIN URINE: NEGATIVE
GLUCOSE, UA: NEGATIVE mg/dL
HGB URINE DIPSTICK: NEGATIVE
Ketones, ur: NEGATIVE mg/dL
LEUKOCYTES UA: NEGATIVE
NITRITE: NEGATIVE
Protein, ur: 30 mg/dL — AB
SPECIFIC GRAVITY, URINE: 1.024 (ref 1.005–1.030)
pH: 6 (ref 5.0–8.0)

## 2016-06-22 LAB — GLUCOSE, CAPILLARY
GLUCOSE-CAPILLARY: 69 mg/dL (ref 65–99)
Glucose-Capillary: 87 mg/dL (ref 65–99)

## 2016-06-22 LAB — WET PREP, GENITAL
Clue Cells Wet Prep HPF POC: NONE SEEN
SPERM: NONE SEEN
TRICH WET PREP: NONE SEEN
YEAST WET PREP: NONE SEEN

## 2016-06-22 LAB — CHLAMYDIA/NGC RT PCR (ARMC ONLY)
Chlamydia Tr: NOT DETECTED
N GONORRHOEAE: NOT DETECTED

## 2016-06-22 LAB — HCG, QUANTITATIVE, PREGNANCY: HCG, BETA CHAIN, QUANT, S: 133593 m[IU]/mL — AB (ref <5)

## 2016-06-22 MED ORDER — SODIUM CHLORIDE 0.9 % IV BOLUS (SEPSIS): 1000.0000 mL | Freq: Once | INTRAVENOUS | Status: DC

## 2016-06-22 MED ORDER — ONDANSETRON HCL 4 MG/2ML IJ SOLN
4.0000 mg | Freq: Once | INTRAMUSCULAR | Status: AC
  Administered 2016-06-22: 4 mg via INTRAVENOUS
  Filled 2016-06-22: qty 2

## 2016-06-22 MED ORDER — SODIUM CHLORIDE 0.9 % IV BOLUS (SEPSIS)
1000.0000 mL | Freq: Once | INTRAVENOUS | Status: AC
  Administered 2016-06-22: 1000 mL via INTRAVENOUS

## 2016-06-22 NOTE — Discharge Instructions (Signed)
See your OB doctor next week.   You may have more bleeding or spotting.   Your blood sugar is slightly low, try to eat normally.   Return to ER if you have vomiting, uncontrolled bleeding, fevers, blood clots from vagina

## 2016-06-22 NOTE — ED Notes (Signed)
Patient transported to Ultrasound 

## 2016-06-22 NOTE — ED Provider Notes (Signed)
[facility]-EMERGENCY DEPARTMENT Provider Note   CSN: [id number] Arrival date & time: 06/22/16  1004  First Provider Contact:  First MD Initiated Contact with Patient 06/22/16 1034        History   Chief Complaint Chief Complaint  Patient presents with  . Vaginal Bleeding    HPI Michelle Holder is a 35 y.o. female hx of HTN, G6P5 here with vaginal spotting. Patient is about 9 weeks by dates. Seen a week ago and has bedside US that confirmed 8 week pregnancy. Was diagnosed with UTI and hyperemesis and finished a course of keflex and is taking zofran as needed. Patient states that she went to [place] Last several days and came back last week. Last 3 days, she has some loose stools and today she be a has some spotting. Also has some vaginal discharge as well. Patient States that she sometimes gets these infection where she was on antibiotics.  She had an episode of vomiting today.   The history is provided by the patient.    Past Medical History:  Diagnosis Date  . AMA (advanced maternal age) multigravida 35+   . Bariatric surgery status   . Dysmenorrhea   . Hypertension   . Knee pain    right  . Obesity   . Thyroid disease    thyroid nodule  . Thyroid nodule   . Vitamin D deficiency     Patient Active Problem List   Diagnosis Date Noted  . Diverticulitis of intestine with abscess 04/08/2016  . Diverticulitis of large intestine with abscess without bleeding   . Dysmenorrhea 07/07/2015  . History of iron deficiency 07/07/2015  . Thyroid nodule 07/07/2015  . Vitamin D deficiency 07/07/2015  . Adult BMI 30+ 03/23/2010  . Benign essential HTN 02/20/2010    Past Surgical History:  Procedure Laterality Date  . CESAREAN SECTION    . LAPAROSCOPIC GASTRECTOMY     SILS    OB History    Gravida Para Term Preterm AB Living   5 4 3 1   4    SAB TAB Ectopic Multiple Live Births         0 4       Home Medications    Prior to Admission medications   Medication Sig  Start Date End Date Taking? Authorizing Provider  metoCLOPramide (REGLAN) 10 MG tablet Take 1 tablet (10 mg total) by mouth every 6 (six) hours as needed for nausea (nausea/headache). 06/15/16  Yes Charlynne Pander, MD  Prenatal Vit-Fe Fumarate-FA (PRENATAL MULTIVITAMIN) TABS tablet Take 1 tablet by mouth daily at 12 noon. 06/11/15  Yes Jody Bovard-Stuckert, MD  Probiotic CAPS Take 1 capsule by mouth 1 day or 1 dose.   Yes Historical Provider, MD    Family History Family History  Problem Relation Age of Onset  . Down syndrome Brother     Social History Social History  Substance Use Topics  . Smoking status: Never Smoker  . Smokeless tobacco: Never Used  . Alcohol use No     Allergies   Review of patient's allergies indicates no known allergies.   Review of Systems Review of Systems  Gastrointestinal: Positive for vomiting.  Genitourinary: Positive for vaginal bleeding and vaginal discharge.  All other systems reviewed and are negative.    Physical Exam Updated Vital Signs BP 132/79   Pulse 60   Temp 98.1 F (36.7 C) (Oral)   Resp 17   Ht 5\' 9"  (1.753 m)   Wt 240  lb (108.9 kg)   LMP  (Approximate)   SpO2 100%   BMI 35.44 kg/m   Physical Exam  Constitutional: She is oriented to person, place, and time. She appears well-developed and well-nourished.  HENT:  Head: Normocephalic.  MM slightly dry   Eyes: EOM are normal. Pupils are equal, round, and reactive to light.  Neck: Normal range of motion. Neck supple.  Cardiovascular: Normal rate and regular rhythm.   Pulmonary/Chest: Effort normal and breath sounds normal.  Abdominal: Soft. Bowel sounds are normal. She exhibits no distension. There is no tenderness.  Genitourinary:  Genitourinary Comments: Whitish discharge, Os closed, no CMT or adnexal tenderness   Musculoskeletal: Normal range of motion.  Neurological: She is alert and oriented to person, place, and time.  Skin: Skin is warm.  Psychiatric: She has a  normal mood and affect.  Nursing note and vitals reviewed.    ED Treatments / Results  Labs (all labs ordered are listed, but only abnormal results are displayed) Labs Reviewed  WET PREP, GENITAL - Abnormal; Notable for the following:       Result Value   WBC, Wet Prep HPF POC MANY (*)    All other components within normal limits  CBC WITH DIFFERENTIAL/PLATELET - Abnormal; Notable for the following:    Lymphs Abs 0.9 (*)    All other components within normal limits  COMPREHENSIVE METABOLIC PANEL - Abnormal; Notable for the following:    Glucose, Bld 63 (*)    ALT 13 (*)    All other components within normal limits  HCG, QUANTITATIVE, PREGNANCY - Abnormal; Notable for the following:    hCG, Beta Chain, Quant, S 133,593 (*)    All other components within normal limits  URINALYSIS COMPLETEWITH MICROSCOPIC ([facility] ONLY) - Abnormal; Notable for the following:    Color, Urine AMBER (*)    APPearance CLEAR (*)    Protein, ur 30 (*)    Bacteria, UA RARE (*)    Squamous Epithelial / LPF 0-5 (*)    All other components within normal limits  CHLAMYDIA/NGC RT PCR ([facility] ONLY)  GLUCOSE, CAPILLARY  CBG MONITORING, ED  CBG MONITORING, ED    EKG  EKG Interpretation None       Radiology US Ob Comp Less 14 Wks  Result Date: 06/22/2016 CLINICAL DATA:  Vaginal bleeding.  First-trimester pregnancy. EXAM: OBSTETRIC <14 WK [place] AND TRANSVAGINAL OB US TECHNIQUE: Both transabdominal and transvaginal ultrasound examinations were performed for complete evaluation of the gestation as well as the maternal uterus, adnexal regions, and pelvic cul-de-sac. Transvaginal technique was performed to assess early pregnancy. COMPARISON:  CT abdomen and pelvis 04/08/2016. FINDINGS: Intrauterine gestational sac: Single Yolk sac:  Present Embryo:  Present Cardiac Activity: Present Heart Rate: 180  bpm CRL:  21  mm   8 w   5 d                  [place] EDC: 01/27/2017 Subchorionic hemorrhage: A small subchorionic  hemorrhage is present. Maternal uterus/adnexae: A posterior sub serosal fibroid measures 1.8 cm maximally. The uterus is otherwise unremarkable. Corpus luteal cyst is evident within the left ovary. IMPRESSION: 1. Single in utero pregnancy with an estimated gestational age of [redacted] weeks and 5 days. 2. Small subchorionic hemorrhage is unlikely be of clinical consequence. Subchorionic hemorrhage of this size is commonly seen during the first trimester. Electronically Signed   By: Marin Roberts M.D.   On: 06/22/2016 13:28   US Ob Transvaginal  Result Date: 06/22/2016 CLINICAL DATA:  Vaginal bleeding.  First-trimester pregnancy. EXAM: OBSTETRIC <14 WK [place] AND TRANSVAGINAL OB US TECHNIQUE: Both transabdominal and transvaginal ultrasound examinations were performed for complete evaluation of the gestation as well as the maternal uterus, adnexal regions, and pelvic cul-de-sac. Transvaginal technique was performed to assess early pregnancy. COMPARISON:  CT abdomen and pelvis 04/08/2016. FINDINGS: Intrauterine gestational sac: Single Yolk sac:  Present Embryo:  Present Cardiac Activity: Present Heart Rate: 180  bpm CRL:  21  mm   8 w   5 d                  [place] EDC: 01/27/2017 Subchorionic hemorrhage: A small subchorionic hemorrhage is present. Maternal uterus/adnexae: A posterior sub serosal fibroid measures 1.8 cm maximally. The uterus is otherwise unremarkable. Corpus luteal cyst is evident within the left ovary. IMPRESSION: 1. Single in utero pregnancy with an estimated gestational age of [redacted] weeks and 5 days. 2. Small subchorionic hemorrhage is unlikely be of clinical consequence. Subchorionic hemorrhage of this size is commonly seen during the first trimester. Electronically Signed   By: Marin Roberts M.D.   On: 06/22/2016 13:28    Procedures Procedures (including critical care time)  Medications Ordered in ED Medications  sodium chloride 0.9 % bolus 1,000 mL (0 mLs Intravenous Stopped 06/22/16 1300)    ondansetron (ZOFRAN) injection 4 mg (4 mg Intravenous Given 06/22/16 1123)     Initial Impression / Assessment and Plan / ED Course  I have reviewed the triage vital signs and the nursing notes.  Pertinent labs & imaging results that were available during my care of the patient were reviewed by me and considered in my medical decision making (see chart for details).  Clinical Course    Michelle Holder is a 35 y.o. female here with vomiting, vaginal spotting and discharge. Likely hyperemsis and possible miscarriage. Was AB positive previously and never received rhogam. Will repeat HCG, labs, US OB.   2:18 PM HCG increased to 133,000 appropriately. US showed 8 week 5 day fetus with small subchorionic hematoma. BMP showed glucose 63. CBG was 69. Given orange juice and CBG improved. Has OB follow up next week. Has zofran at home. Recommend continue hydration. Told her that she may be having a miscarriage and gave strict return precautions.    Final Clinical Impressions(s) / ED Diagnoses   Final diagnoses:  Vaginal bleeding    New Prescriptions New Prescriptions   No medications on file     Charlynne Pander, MD 06/22/16 1419

## 2016-06-22 NOTE — ED Triage Notes (Signed)
Pt c/o abd cramping since Tuesday with loose stools and today having spotting, states she is [redacted] weeks pregnant.Marland Kitchen

## 2016-06-23 ENCOUNTER — Encounter: Payer: Self-pay | Admitting: Emergency Medicine

## 2016-06-23 ENCOUNTER — Emergency Department
Admission: EM | Admit: 2016-06-23 | Discharge: 2016-06-23 | Disposition: A | Discharge status: Home / Self Care | Payer: Medicaid Other | Attending: Emergency Medicine | Admitting: Emergency Medicine

## 2016-06-23 DIAGNOSIS — R102 Pelvic and perineal pain: Secondary | ICD-10-CM | POA: Insufficient documentation

## 2016-06-23 DIAGNOSIS — O209 Hemorrhage in early pregnancy, unspecified: Secondary | ICD-10-CM | POA: Insufficient documentation

## 2016-06-23 DIAGNOSIS — Z3A08 8 weeks gestation of pregnancy: Secondary | ICD-10-CM | POA: Insufficient documentation

## 2016-06-23 DIAGNOSIS — Z79899 Other long term (current) drug therapy: Secondary | ICD-10-CM | POA: Diagnosis not present

## 2016-06-23 DIAGNOSIS — I10 Essential (primary) hypertension: Secondary | ICD-10-CM | POA: Insufficient documentation

## 2016-06-23 LAB — HCG, QUANTITATIVE, PREGNANCY: HCG, BETA CHAIN, QUANT, S: 171582 m[IU]/mL — AB (ref <5)

## 2016-06-23 NOTE — ED Provider Notes (Signed)
[facility] Emergency Department Provider Note   ____________________________________________    I have reviewed the triage vital signs and the nursing notes.   HISTORY  Chief Complaint Vaginal Bleeding     HPI Michelle Holder is a 35 y.o. female who presents with complaints of vaginal bleeding. Patient was seen in the emergency department yesterday for the same and ultrasound which showed a small subchorionic hemorrhage. She reports that she became concerned because she passed a clot from her vagina today and initially had heavier bleeding which seems to have subsided now. She denies abdominal pain. No pelvic cramping. She has follow-up with her OB in 3 days   Past Medical History:  Diagnosis Date  . AMA (advanced maternal age) multigravida 35+   . Bariatric surgery status   . Dysmenorrhea   . Hypertension   . Knee pain    right  . Obesity   . Thyroid disease    thyroid nodule  . Thyroid nodule   . Vitamin D deficiency     Patient Active Problem List   Diagnosis Date Noted  . Diverticulitis of intestine with abscess 04/08/2016  . Diverticulitis of large intestine with abscess without bleeding   . Dysmenorrhea 07/07/2015  . History of iron deficiency 07/07/2015  . Thyroid nodule 07/07/2015  . Vitamin D deficiency 07/07/2015  . Adult BMI 30+ 03/23/2010  . Benign essential HTN 02/20/2010    Past Surgical History:  Procedure Laterality Date  . CESAREAN SECTION    . LAPAROSCOPIC GASTRECTOMY     SILS    Prior to Admission medications   Medication Sig Start Date End Date Taking? Authorizing Provider  metoCLOPramide (REGLAN) 10 MG tablet Take 1 tablet (10 mg total) by mouth every 6 (six) hours as needed for nausea (nausea/headache). 06/15/16   Charlynne Pander, MD  Prenatal Vit-Fe Fumarate-FA (PRENATAL MULTIVITAMIN) TABS tablet Take 1 tablet by mouth daily at 12 noon. 06/11/15   Sherian Rein, MD  Probiotic CAPS Take 1  capsule by mouth 1 day or 1 dose.    Historical Provider, MD     Allergies Review of patient's allergies indicates no known allergies.  Family History  Problem Relation Age of Onset  . Down syndrome Brother     Social History Social History  Substance Use Topics  . Smoking status: Never Smoker  . Smokeless tobacco: Never Used  . Alcohol use No    Review of Systems  Constitutional: No Dizziness  Gastrointestinal: No abdominal pain.  No nausea, no vomiting.   Genitourinary: Negative for dysuria. Vaginal bleeding as above Musculoskeletal: Negative for back pain. Skin: Negative for rash.   10-point ROS otherwise negative.  ____________________________________________   PHYSICAL EXAM:  VITAL SIGNS: ED Triage Vitals  Enc Vitals Group     BP 06/23/16 0929 (!) 145/99     Pulse Rate 06/23/16 0929 72     Resp 06/23/16 0929 18     Temp 06/23/16 0929 97.8 F (36.6 C)     Temp Source 06/23/16 0929 Oral     SpO2 06/23/16 0929 100 %     Weight 06/23/16 0929 230 lb (104.3 kg)     Height 06/23/16 0929  (1.753 m)     Head Circumference --      Peak Flow --      Pain Score 06/23/16 0930 2     Pain Loc --      Pain Edu? --  Excl. in GC? --     Constitutional: Alert and oriented. No acute distress. Pleasant and interactive Eyes: Conjunctivae are normal.   Nose: No congestion/rhinnorhea. Mouth/Throat: Mucous membranes are moist.    Cardiovascular: Normal rate, regular rhythm.  Good peripheral circulation. Respiratory: Normal respiratory effort.  No retractions. Gastrointestinal: Soft and nontender. No distention.  No CVA tenderness. Genitourinary: deferred  Neurologic:  Normal speech and language. No gross focal neurologic deficits are appreciated.  Skin:  Skin is warm, dry and intact. No rash noted. Psychiatric: Mood and affect are normal. Speech and behavior are normal.  ____________________________________________   LABS (all labs ordered are listed,  but only abnormal results are displayed)  Labs Reviewed  HCG, QUANTITATIVE, PREGNANCY - Abnormal; Notable for the following:       Result Value   hCG, Beta Chain, Quant, S 171,582 (*)    All other components within normal limits   ____________________________________________  EKG  None ____________________________________________  RADIOLOGY  None Reviewed ultrasound from yesterday ____________________________________________   PROCEDURES  Procedure(s) performed: No    Critical Care performed: No ____________________________________________   INITIAL IMPRESSION / ASSESSMENT AND PLAN / ED COURSE  Pertinent labs & imaging results that were available during my care of the patient were reviewed by me and considered in my medical decision making (see chart for details).  Patient reports improved bleeding. Beta-hCG is increasing. The patient has follow-up with OB in 3 days. We discussed return precautions. Patient is AB+  Clinical Course   ____________________________________________   FINAL CLINICAL IMPRESSION(S) / ED DIAGNOSES  Final diagnoses:  Vaginal bleeding in pregnancy, first trimester      NEW MEDICATIONS STARTED DURING THIS VISIT:  New Prescriptions   No medications on file     Note:  This document was prepared using Dragon voice recognition software and may include unintentional dictation errors.    Jene Every, MD 06/23/16 [id number]

## 2016-06-23 NOTE — ED Triage Notes (Signed)
Patient presents to the ED with vaginal bleeding this morning and mild pelvic cramping.  Patient reports being about [redacted] weeks pregnant and was seen yesterday in the ED and diagnosed with a subchorionic hematoma.  Patient reports passing a small clot this morning and then reports bright red blood, "dripping" the next time she urinated.  Patient reports wearing a pad at this time but has not yet had to change it.  This is patient's fifth pregnancy and she has had four children.

## 2016-06-23 NOTE — ED Notes (Signed)
Pt reports being seen yesterday for spotting and everything looked okay but this am when she went to the BR she noticed a large blood clot and then she has noticed that the blood flow is heavy

## 2016-07-11 ENCOUNTER — Encounter (HOSPITAL_COMMUNITY): Payer: Self-pay | Admitting: *Deleted

## 2016-07-11 ENCOUNTER — Inpatient Hospital Stay (HOSPITAL_COMMUNITY)
Admission: AD | Admit: 2016-07-11 | Discharge: 2016-07-11 | Disposition: A | Discharge status: Home / Self Care | Payer: Medicaid Other | Source: Ambulatory Visit | Attending: Obstetrics and Gynecology | Admitting: Obstetrics and Gynecology

## 2016-07-11 DIAGNOSIS — E559 Vitamin D deficiency, unspecified: Secondary | ICD-10-CM | POA: Diagnosis not present

## 2016-07-11 DIAGNOSIS — O26891 Other specified pregnancy related conditions, first trimester: Secondary | ICD-10-CM | POA: Insufficient documentation

## 2016-07-11 DIAGNOSIS — O9989 Other specified diseases and conditions complicating pregnancy, childbirth and the puerperium: Secondary | ICD-10-CM

## 2016-07-11 DIAGNOSIS — O99841 Bariatric surgery status complicating pregnancy, first trimester: Secondary | ICD-10-CM | POA: Diagnosis not present

## 2016-07-11 DIAGNOSIS — Z3A10 10 weeks gestation of pregnancy: Secondary | ICD-10-CM | POA: Diagnosis not present

## 2016-07-11 DIAGNOSIS — O99351 Diseases of the nervous system complicating pregnancy, first trimester: Secondary | ICD-10-CM | POA: Insufficient documentation

## 2016-07-11 DIAGNOSIS — O99281 Endocrine, nutritional and metabolic diseases complicating pregnancy, first trimester: Secondary | ICD-10-CM | POA: Insufficient documentation

## 2016-07-11 DIAGNOSIS — R109 Unspecified abdominal pain: Secondary | ICD-10-CM | POA: Diagnosis not present

## 2016-07-11 DIAGNOSIS — R51 Headache: Secondary | ICD-10-CM | POA: Diagnosis present

## 2016-07-11 DIAGNOSIS — E669 Obesity, unspecified: Secondary | ICD-10-CM | POA: Insufficient documentation

## 2016-07-11 DIAGNOSIS — E079 Disorder of thyroid, unspecified: Secondary | ICD-10-CM | POA: Diagnosis not present

## 2016-07-11 DIAGNOSIS — G43009 Migraine without aura, not intractable, without status migrainosus: Secondary | ICD-10-CM

## 2016-07-11 DIAGNOSIS — O99211 Obesity complicating pregnancy, first trimester: Secondary | ICD-10-CM | POA: Diagnosis not present

## 2016-07-11 DIAGNOSIS — O161 Unspecified maternal hypertension, first trimester: Secondary | ICD-10-CM | POA: Diagnosis not present

## 2016-07-11 DIAGNOSIS — G43909 Migraine, unspecified, not intractable, without status migrainosus: Secondary | ICD-10-CM | POA: Diagnosis not present

## 2016-07-11 DIAGNOSIS — E860 Dehydration: Secondary | ICD-10-CM | POA: Diagnosis not present

## 2016-07-11 DIAGNOSIS — Z79899 Other long term (current) drug therapy: Secondary | ICD-10-CM | POA: Diagnosis not present

## 2016-07-11 LAB — URINALYSIS, ROUTINE W REFLEX MICROSCOPIC
Bilirubin Urine: NEGATIVE
Glucose, UA: NEGATIVE mg/dL
KETONES UR: NEGATIVE mg/dL
LEUKOCYTES UA: NEGATIVE
NITRITE: NEGATIVE
PROTEIN: NEGATIVE mg/dL
Specific Gravity, Urine: 1.025 (ref 1.005–1.030)
pH: 6 (ref 5.0–8.0)

## 2016-07-11 LAB — URINE MICROSCOPIC-ADD ON

## 2016-07-11 LAB — GLUCOSE, CAPILLARY: Glucose-Capillary: 83 mg/dL (ref 65–99)

## 2016-07-11 MED ORDER — LACTATED RINGERS IV BOLUS (SEPSIS)
1000.0000 mL | Freq: Once | INTRAVENOUS | Status: AC
  Administered 2016-07-11: 1000 mL via INTRAVENOUS

## 2016-07-11 MED ORDER — BUTALBITAL-APAP-CAFFEINE 50-325-40 MG PO TABS
1.0000 | ORAL_TABLET | Freq: Once | ORAL | Status: AC
  Administered 2016-07-11: 1 via ORAL
  Filled 2016-07-11: qty 1

## 2016-07-11 NOTE — MAU Note (Signed)
Been at this point before, when she gets dehydrated. Started on Thursday, gets extreme nauseated. Had been taking nausea med, but doesn't seem to be working now.  Last 2 days has had headaches. Nurse was supposed to have called in rx for migraine med; pharmacy says they didn't get it. This morning, started cramping in lower abd.  When she got to this point before she needed fluids.

## 2016-07-11 NOTE — MAU Provider Note (Signed)
History     CSN: [id number]  Arrival date and time: 07/11/16 1317   First Provider Initiated Contact with Patient 07/11/16 1401      Chief Complaint  Patient presents with  . Headache  . Emesis  . Abdominal Cramping   HPI  Patient is a 35 year old G5 P4 at 10 weeks and 5 days who presents with ongoing nausea and concerns for dehydration. She reports she's had to get fluids 2 other times in this pregnancy so far and it really helps her feel much better. She has not had significant vomiting but states she's been taking a whole lot of by mouth because of the nausea she has been taking Diclegis which she reports helps significantly initially but has since had less effect. She states ever since she took her kids to the pool a few days ago she has not felt well. She is trying to drink enough fluids to make up for it but just has not been able to catch up. She reports dark urine. She denies any significant abdominal pain just some minimal cramping. She has no vaginal bleeding.  OB History    Gravida Para Term Preterm AB Living   5 4 3 1   4    SAB TAB Ectopic Multiple Live Births         0 4      Past Medical History:  Diagnosis Date  . AMA (advanced maternal age) multigravida 35+   . Bariatric surgery status   . Dysmenorrhea   . Hypertension   . Knee pain    right  . Obesity   . Thyroid disease    thyroid nodule  . Thyroid nodule   . Vitamin D deficiency     Past Surgical History:  Procedure Laterality Date  . CESAREAN SECTION    . LAPAROSCOPIC GASTRECTOMY     SILS    Family History  Problem Relation Age of Onset  . Down syndrome Brother     Social History  Substance Use Topics  . Smoking status: Never Smoker  . Smokeless tobacco: Never Used  . Alcohol use No    Allergies: No Known Allergies  Prescriptions Prior to Admission  Medication Sig Dispense Refill Last Dose  . metoCLOPramide (REGLAN) 10 MG tablet Take 1 tablet (10 mg total) by mouth every 6 (six)  hours as needed for nausea (nausea/headache). 10 tablet 0 prn at prn  . Prenatal Vit-Fe Fumarate-FA (PRENATAL MULTIVITAMIN) TABS tablet Take 1 tablet by mouth daily at 12 noon. 30 tablet 12 06/21/2016 at 1200  . Probiotic CAPS Take 1 capsule by mouth 1 day or 1 dose.   06/21/2016 at Unknown time    Review of Systems  Constitutional: Negative for chills and fever.  HENT: Negative for hearing loss.   Eyes: Negative for blurred vision and double vision.  Respiratory: Negative for cough and hemoptysis.   Cardiovascular: Negative for chest pain and palpitations.  Gastrointestinal: Positive for abdominal pain and nausea. Negative for diarrhea, heartburn and vomiting.  Genitourinary: Negative for dysuria, frequency and urgency.  Musculoskeletal: Negative for back pain and myalgias.  Skin: Negative for itching and rash.  Neurological: Negative for dizziness, tingling and headaches.  Endo/Heme/Allergies: Negative for environmental allergies. Does not bruise/bleed easily.  Psychiatric/Behavioral: Negative for depression and suicidal ideas.   Physical Exam   Blood pressure 154/95, pulse 74, temperature 98.6 F (37 C), temperature source Oral, resp. rate 16, weight 244 lb 8 oz (110.9 kg), last menstrual period 03/28/2016, currently  breastfeeding.  Physical Exam  Constitutional: She is oriented to person, place, and time. She appears well-developed and well-nourished.  HENT:  Head: Normocephalic and atraumatic.  Cardiovascular: Normal rate and regular rhythm.  Exam reveals no gallop and no friction rub.   No murmur heard. Respiratory: Effort normal and breath sounds normal. No respiratory distress.  GI: Soft. Bowel sounds are normal. She exhibits no distension. There is no tenderness. There is no rebound.  gravid  Musculoskeletal: Normal range of motion. She exhibits no edema.  Neurological: She is alert and oriented to person, place, and time.  Skin: Skin is warm and dry. She is not diaphoretic.   Psychiatric: She has a normal mood and affect. Her behavior is normal.    MAU Course  Procedures  MDM In the MAU patient reports continued minimal headache and mild abdominal cramping. Given these symptoms we will give her a liter of IV fluid. Patient be observed during that period and reevaluated.  Pt re-evaluated and is feeling much better after IVF and fiorocet.   On discharge pt noted to have elevated BP. D/W Dr. Ellyn Hackbovard and ok for discharge with close monitoring. Call if Bp elevated greater than 160/95,.  Assessment and Plan  1. Chronic hypertension: monitor BP follow up in office 2. Dehydration: s/p IVF feeling better 3. Migraine s/p fiorocet felling better.   Ernestina Pennaicholas Avonne Berkery 07/11/2016, 2:02 PM

## 2016-07-11 NOTE — Discharge Instructions (Signed)

## 2016-08-30 ENCOUNTER — Encounter (HOSPITAL_COMMUNITY): Payer: Self-pay | Admitting: *Deleted

## 2016-08-30 ENCOUNTER — Inpatient Hospital Stay (HOSPITAL_COMMUNITY)
Admission: AD | Admit: 2016-08-30 | Discharge: 2016-08-30 | Disposition: A | Discharge status: Home / Self Care | Payer: Medicaid Other | Source: Ambulatory Visit | Attending: Obstetrics and Gynecology | Admitting: Obstetrics and Gynecology

## 2016-08-30 DIAGNOSIS — O09522 Supervision of elderly multigravida, second trimester: Secondary | ICD-10-CM | POA: Diagnosis not present

## 2016-08-30 DIAGNOSIS — B3731 Acute candidiasis of vulva and vagina: Secondary | ICD-10-CM

## 2016-08-30 DIAGNOSIS — N898 Other specified noninflammatory disorders of vagina: Secondary | ICD-10-CM | POA: Diagnosis present

## 2016-08-30 DIAGNOSIS — N760 Acute vaginitis: Secondary | ICD-10-CM

## 2016-08-30 DIAGNOSIS — O23592 Infection of other part of genital tract in pregnancy, second trimester: Secondary | ICD-10-CM | POA: Diagnosis not present

## 2016-08-30 DIAGNOSIS — Z3A18 18 weeks gestation of pregnancy: Secondary | ICD-10-CM | POA: Diagnosis not present

## 2016-08-30 DIAGNOSIS — O10919 Unspecified pre-existing hypertension complicating pregnancy, unspecified trimester: Secondary | ICD-10-CM

## 2016-08-30 DIAGNOSIS — O98812 Other maternal infectious and parasitic diseases complicating pregnancy, second trimester: Secondary | ICD-10-CM

## 2016-08-30 DIAGNOSIS — B9689 Other specified bacterial agents as the cause of diseases classified elsewhere: Secondary | ICD-10-CM | POA: Diagnosis not present

## 2016-08-30 DIAGNOSIS — B373 Candidiasis of vulva and vagina: Secondary | ICD-10-CM | POA: Insufficient documentation

## 2016-08-30 DIAGNOSIS — O10012 Pre-existing essential hypertension complicating pregnancy, second trimester: Secondary | ICD-10-CM | POA: Insufficient documentation

## 2016-08-30 LAB — WET PREP, GENITAL
SPERM: NONE SEEN
Trich, Wet Prep: NONE SEEN

## 2016-08-30 LAB — URINALYSIS, ROUTINE W REFLEX MICROSCOPIC
Bilirubin Urine: NEGATIVE
GLUCOSE, UA: NEGATIVE mg/dL
Ketones, ur: NEGATIVE mg/dL
Nitrite: NEGATIVE
Protein, ur: NEGATIVE mg/dL
SPECIFIC GRAVITY, URINE: 1.025 (ref 1.005–1.030)
pH: 6 (ref 5.0–8.0)

## 2016-08-30 LAB — URINE MICROSCOPIC-ADD ON

## 2016-08-30 MED ORDER — METRONIDAZOLE 500 MG PO TABS: 500.0000 mg | ORAL_TABLET | Freq: Two times a day (BID) | ORAL | 0 refills | Status: DC

## 2016-08-30 MED ORDER — TERCONAZOLE 0.8 % VA CREA
1.0000 | TOPICAL_CREAM | Freq: Every day | VAGINAL | 0 refills | Status: DC
Start: 2016-08-30 — End: 2016-11-20

## 2016-08-30 NOTE — MAU Provider Note (Signed)
History     CSN: [id number]  Arrival date and time: 08/30/16 1559   First Provider Initiated Contact with Patient 08/30/16 1700      Chief Complaint  Patient presents with  . Vaginal Discharge   HPI Michelle Holder Michelle Holder is a 35 y.o. [id number] at [redacted]w[redacted]d who presents with vaginal discharge. Started this morning; states she stood up and felt a gush come out into her underwear; looked yellow and thin. States she has continued to leak since then. Denies vaginal bleeding, itching or irritation. Reports some lower abdominal cramping that she rates 2/10. Has not treated.   OB History    Gravida Para Term Preterm AB Living   5 4 3 1   4    SAB TAB Ectopic Multiple Live Births         0 4      Past Medical History:  Diagnosis Date  . AMA (advanced maternal age) multigravida 35+   . Bariatric surgery status   . Dysmenorrhea   . Hypertension   . Knee pain    right  . Obesity   . Thyroid disease    thyroid nodule  . Thyroid nodule   . Vitamin D deficiency     Past Surgical History:  Procedure Laterality Date  . CESAREAN SECTION    . LAPAROSCOPIC GASTRECTOMY     SILS    Family History  Problem Relation Age of Onset  . Down syndrome Brother     Social History  Substance Use Topics  . Smoking status: Never Smoker  . Smokeless tobacco: Never Used  . Alcohol use No    Allergies: No Known Allergies  Prescriptions Prior to Admission  Medication Sig Dispense Refill Last Dose  . oxyCODONE-acetaminophen (PERCOCET/ROXICET) 5-325 MG tablet Take 1 tablet by mouth every 6 (six) hours as needed for severe pain.   08/30/2016 at Unknown time  . Prenatal Vit-Fe Fumarate-FA (PRENATAL MULTIVITAMIN) TABS tablet Take 1 tablet by mouth daily at 12 noon. 30 tablet 12 08/30/2016 at Unknown time  . metoCLOPramide (REGLAN) 10 MG tablet Take 1 tablet (10 mg total) by mouth every 6 (six) hours as needed for nausea (nausea/headache). (Patient not taking: Reported on 08/30/2016) 10 tablet 0 Not  Taking at Unknown time    Review of Systems  Constitutional: Negative.   HENT:       + mouth pain r/t recent root canal  Gastrointestinal: Positive for abdominal pain. Negative for constipation, diarrhea, nausea and vomiting.  Genitourinary: Negative for dysuria.       + vaginal discharge No vaginal bleeding   Physical Exam   Blood pressure 154/89, pulse (!) 56, temperature 99.2 F (37.3 C), resp. rate 18, height 5\' 8"  (1.727 m), weight 251 lb 6.4 oz (114 kg), last menstrual period 03/28/2016, currently breastfeeding.  Physical Exam  Nursing note and vitals reviewed. Constitutional: She is oriented to person, place, and time. She appears well-developed and well-nourished. No distress.  HENT:  Head: Normocephalic and atraumatic.  Eyes: Conjunctivae are normal. Right eye exhibits no discharge. Left eye exhibits no discharge. No scleral icterus.  Neck: Normal range of motion.  Cardiovascular: Normal rate, regular rhythm and normal heart sounds.   No murmur heard. Respiratory: Effort normal and breath sounds normal. No respiratory distress. She has no wheezes.  GI: Soft. There is no tenderness. There is no rebound.  Genitourinary: There is erythema in the vagina. No bleeding in the vagina. Vaginal discharge (moderate amount of white clumpy discharge mixed with  some yellow creamy discharge) found.  Genitourinary Comments: No pooling Cervix closed  Neurological: She is alert and oriented to person, place, and time.  Skin: Skin is warm and dry. She is not diaphoretic.  Psychiatric: She has a normal mood and affect. Her behavior is normal. Judgment and thought content normal.    MAU Course  Procedures Results for orders placed or performed during the hospital encounter of 08/30/16 (from the past 24 hour(s))  Urinalysis, Routine w reflex microscopic ([facility])     Status: Abnormal   Collection Time: 08/30/16  4:20 PM  Result Value Ref Range   Color, Urine YELLOW YELLOW    APPearance CLEAR CLEAR   Specific Gravity, Urine 1.025 1.005 - 1.030   pH 6.0 5.0 - 8.0   Glucose, UA NEGATIVE NEGATIVE mg/dL   Hgb urine dipstick SMALL (A) NEGATIVE   Bilirubin Urine NEGATIVE NEGATIVE   Ketones, ur NEGATIVE NEGATIVE mg/dL   Protein, ur NEGATIVE NEGATIVE mg/dL   Nitrite NEGATIVE NEGATIVE   Leukocytes, UA SMALL (A) NEGATIVE  Urine microscopic-add on     Status: Abnormal   Collection Time: 08/30/16  4:20 PM  Result Value Ref Range   Squamous Epithelial / LPF 0-5 (A) NONE SEEN   WBC, UA 0-5 0 - 5 WBC/hpf   RBC / HPF 0-5 0 - 5 RBC/hpf   Bacteria, UA MANY (A) NONE SEEN  Wet prep, genital     Status: Abnormal   Collection Time: 08/30/16  5:10 PM  Result Value Ref Range   Yeast Wet Prep HPF POC PRESENT (A) NONE SEEN   Trich, Wet Prep NONE SEEN NONE SEEN   Clue Cells Wet Prep HPF POC PRESENT (A) NONE SEEN   WBC, Wet Prep HPF POC MODERATE (A) NONE SEEN   Sperm NONE SEEN     MDM FHT 155 by doppler Cervix closed No pooling, fern negative GC/CT & wet prep sent Discussed BPs with patient; pt previously prescribed labetalol by Dr. Mindi Holder but didn't take it; pt tells me that she "does not have high blood pressure" & will not discuss it further. Denies headache, chest pain, or SOB S/w Dr. Ellyn Holder; ok to discharge home  Assessment and Plan  A: 1. BV (bacterial vaginosis)   2. Vaginal yeast infection   3. Chronic hypertension during pregnancy    P: Discharge home Rx flagyl & terazol Discussed reasons to return to MAU Pt has next appt on Monday & will have BP checked then per Dr. Cristine Holder  Michelle Holder 08/30/2016, 4:59 PM

## 2016-08-30 NOTE — Discharge Instructions (Signed)
Monilial Vaginitis °Vaginitis in a soreness, swelling and redness (inflammation) of the vagina and vulva. Monilial vaginitis is not a sexually transmitted infection. °CAUSES  °Yeast vaginitis is caused by yeast (candida) that is normally found in your vagina. With a yeast infection, the candida has overgrown in number to a point that upsets the chemical balance. °SYMPTOMS  °· White, thick vaginal discharge. °· Swelling, itching, redness and irritation of the vagina and possibly the lips of the vagina (vulva). °· Burning or painful urination. °· Painful intercourse. °DIAGNOSIS  °Things that may contribute to monilial vaginitis are: °· Postmenopausal and virginal states. °· Pregnancy. °· Infections. °· Being tired, sick or stressed, especially if you had monilial vaginitis in the past. °· Diabetes. Good control will help lower the chance. °· Birth control pills. °· Tight fitting garments. °· Using bubble bath, feminine sprays, douches or deodorant tampons. °· Taking certain medications that kill germs (antibiotics). °· Sporadic recurrence can occur if you become ill. °TREATMENT  °Your caregiver will give you medication. °· There are several kinds of anti monilial vaginal creams and suppositories specific for monilial vaginitis. For recurrent yeast infections, use a suppository or cream in the vagina 2 times a week, or as directed. °· Anti-monilial or steroid cream for the itching or irritation of the vulva may also be used. Get your caregiver's permission. °· Painting the vagina with methylene blue solution may help if the monilial cream does not work. °· Eating yogurt may help prevent monilial vaginitis. °HOME CARE INSTRUCTIONS  °· Finish all medication as prescribed. °· Do not have sex until treatment is completed or after your caregiver tells you it is okay. °· Take warm sitz baths. °· Do not douche. °· Do not use tampons, especially scented ones. °· Wear cotton underwear. °· Avoid tight pants and panty  hose. °· Tell your sexual partner that you have a yeast infection. They should go to their caregiver if they have symptoms such as mild rash or itching. °· Your sexual partner should be treated as well if your infection is difficult to eliminate. °· Practice safer sex. Use condoms. °· Some vaginal medications cause latex condoms to fail. Vaginal medications that harm condoms are: °¨ Cleocin cream. °¨ Butoconazole (Femstat®). °¨ Terconazole (Terazol®) vaginal suppository. °¨ Miconazole (Monistat®) (may be purchased over the counter). °SEEK MEDICAL CARE IF:  °· You have a temperature by mouth above 102° F (38.9° C). °· The infection is getting worse after 2 days of treatment. °· The infection is not getting better after 3 days of treatment. °· You develop blisters in or around your vagina. °· You develop vaginal bleeding, and it is not your menstrual period. °· You have pain when you urinate. °· You develop intestinal problems. °· You have pain with sexual intercourse. °  °This information is not intended to replace advice given to you by your health care provider. Make sure you discuss any questions you have with your health care provider. °  °Document Released: 08/15/2005 Document Revised: 01/28/2012 Document Reviewed: 05/09/2015 °Elsevier Interactive Patient Education ©2016 [employer] ° °Bacterial Vaginosis °Bacterial vaginosis is a vaginal infection that occurs when the normal balance of bacteria in the vagina is disrupted. It results from an overgrowth of certain bacteria. This is the most common vaginal infection in women of childbearing age. Treatment is important to prevent complications, especially in pregnant women, as it can cause a premature delivery. °CAUSES  °Bacterial vaginosis is caused by an increase in harmful bacteria that are normally   present in smaller amounts in the vagina. Several different kinds of bacteria can cause bacterial vaginosis. However, the reason that the condition develops is not  fully understood. °RISK FACTORS °Certain activities or behaviors can put you at an increased risk of developing bacterial vaginosis, including: °· Having a new sex partner or multiple sex partners. °· Douching. °· Using an intrauterine device (IUD) for contraception. °Women do not get bacterial vaginosis from toilet seats, bedding, swimming pools, or contact with objects around them. °SIGNS AND SYMPTOMS  °Some women with bacterial vaginosis have no signs or symptoms. Common symptoms include: °· Grey vaginal discharge. °· A fishlike odor with discharge, especially after sexual intercourse. °· Itching or burning of the vagina and vulva. °· Burning or pain with urination. °DIAGNOSIS  °Your health care provider will take a medical history and examine the vagina for signs of bacterial vaginosis. A sample of vaginal fluid may be taken. Your health care provider will look at this sample under a microscope to check for bacteria and abnormal cells. A vaginal pH test may also be done.  °TREATMENT  °Bacterial vaginosis may be treated with antibiotic medicines. These may be given in the form of a pill or a vaginal cream. A second round of antibiotics may be prescribed if the condition comes back after treatment. Because bacterial vaginosis increases your risk for sexually transmitted diseases, getting treated can help reduce your risk for chlamydia, gonorrhea, HIV, and herpes. °HOME CARE INSTRUCTIONS  °· Only take over-the-counter or prescription medicines as directed by your health care provider. °· If antibiotic medicine was prescribed, take it as directed. Make sure you finish it even if you start to feel better. °· During treatment, it is important that you follow these instructions: °· Avoid sexual activity or use condoms correctly. °· Do not douche. °· Avoid alcohol as directed by your health care provider. °· Avoid breastfeeding as directed by your health care provider. °SEEK MEDICAL CARE IF:  °· Your symptoms are not  improving after 3 days of treatment. °· You have increased discharge or pain. °· You have a fever. °MAKE SURE YOU:  °· Understand these instructions. °· Will watch your condition. °· Will get help right away if you are not doing well or get worse. °FOR MORE INFORMATION  °Centers for Disease Control and Prevention, Division of STD Prevention: www.cdc.gov/std °American Sexual Health Association (ASHA): www.ashastd.org  °  °This information is not intended to replace advice given to you by your health care provider. Make sure you discuss any questions you have with your health care provider. °  °Document Released: 11/05/2005 Document Revised: 11/26/2014 Document Reviewed: 06/17/2013 °Elsevier Interactive Patient Education ©2016 Elsevier Inc. ° °

## 2016-08-30 NOTE — MAU Note (Signed)
Pt reports she got from sitting after 2 hours and felt a gush of fluid. Noticed thin   yellow discharge. Has had if off and on all afternoon. reports some mild cramping now aw well.

## 2016-08-31 LAB — GC/CHLAMYDIA PROBE AMP (~~LOC~~) NOT AT ARMC
Chlamydia: NEGATIVE
Neisseria Gonorrhea: NEGATIVE

## 2016-11-20 ENCOUNTER — Observation Stay (HOSPITAL_COMMUNITY)
Admission: AD | Admit: 2016-11-20 | Discharge: 2016-11-21 | Disposition: A | Discharge status: Home / Self Care | Payer: Medicaid Other | Attending: Obstetrics and Gynecology | Admitting: Obstetrics and Gynecology

## 2016-11-20 DIAGNOSIS — Z3A37 37 weeks gestation of pregnancy: Secondary | ICD-10-CM

## 2016-11-20 DIAGNOSIS — O113 Pre-existing hypertension with pre-eclampsia, third trimester: Secondary | ICD-10-CM | POA: Diagnosis not present

## 2016-11-20 DIAGNOSIS — O10013 Pre-existing essential hypertension complicating pregnancy, third trimester: Secondary | ICD-10-CM | POA: Diagnosis not present

## 2016-11-20 DIAGNOSIS — O119 Pre-existing hypertension with pre-eclampsia, unspecified trimester: Secondary | ICD-10-CM

## 2016-11-20 DIAGNOSIS — Z3A30 30 weeks gestation of pregnancy: Secondary | ICD-10-CM | POA: Diagnosis not present

## 2016-11-20 LAB — FETAL FIBRONECTIN: Fetal Fibronectin: NEGATIVE

## 2016-11-20 LAB — CBC
HCT: 34.9 % — ABNORMAL LOW (ref 36.0–46.0)
Hemoglobin: 12.2 g/dL (ref 12.0–15.0)
MCH: 32.2 pg (ref 26.0–34.0)
MCHC: 35 g/dL (ref 30.0–36.0)
MCV: 92.1 fL (ref 78.0–100.0)
PLATELETS: 212 10*3/uL (ref 150–400)
RBC: 3.79 MIL/uL — AB (ref 3.87–5.11)
RDW: 12.7 % (ref 11.5–15.5)
WBC: 5.6 10*3/uL (ref 4.0–10.5)

## 2016-11-20 LAB — COMPREHENSIVE METABOLIC PANEL
ALBUMIN: 3.1 g/dL — AB (ref 3.5–5.0)
ALT: 14 U/L (ref 14–54)
AST: 18 U/L (ref 15–41)
Alkaline Phosphatase: 87 U/L (ref 38–126)
Anion gap: 6 (ref 5–15)
BUN: 9 mg/dL (ref 6–20)
CHLORIDE: 106 mmol/L (ref 101–111)
CO2: 22 mmol/L (ref 22–32)
CREATININE: 0.83 mg/dL (ref 0.44–1.00)
Calcium: 8.5 mg/dL — ABNORMAL LOW (ref 8.9–10.3)
GFR calc non Af Amer: >60 mL/min (ref >60)
Glucose, Bld: 89 mg/dL (ref 65–99)
Potassium: 3.8 mmol/L (ref 3.5–5.1)
SODIUM: 134 mmol/L — AB (ref 135–145)
Total Bilirubin: 0.2 mg/dL — ABNORMAL LOW (ref 0.3–1.2)
Total Protein: 6.2 g/dL — ABNORMAL LOW (ref 6.5–8.1)

## 2016-11-20 LAB — URINALYSIS, ROUTINE W REFLEX MICROSCOPIC
BACTERIA UA: NONE SEEN
BILIRUBIN URINE: NEGATIVE
Glucose, UA: NEGATIVE mg/dL
Ketones, ur: NEGATIVE mg/dL
NITRITE: NEGATIVE
PH: 6 (ref 5.0–8.0)
Protein, ur: 30 mg/dL — AB
SPECIFIC GRAVITY, URINE: 1.006 (ref 1.005–1.030)
WBC, UA: NONE SEEN WBC/hpf (ref 0–5)

## 2016-11-20 LAB — PROTEIN / CREATININE RATIO, URINE
Creatinine, Urine: 61 mg/dL
Protein Creatinine Ratio: 0.33 mg/mg{Cre} — ABNORMAL HIGH (ref 0.00–0.15)
TOTAL PROTEIN, URINE: 20 mg/dL

## 2016-11-20 LAB — TYPE AND SCREEN
ABO/RH(D): AB POS
ANTIBODY SCREEN: NEGATIVE

## 2016-11-20 MED ORDER — DOCUSATE SODIUM 100 MG PO CAPS
100.0000 mg | ORAL_CAPSULE | Freq: Every day | ORAL | Status: DC
  Administered 2016-11-21: 100 mg via ORAL
  Filled 2016-11-20: qty 1

## 2016-11-20 MED ORDER — LABETALOL HCL 100 MG PO TABS
200.0000 mg | ORAL_TABLET | Freq: Two times a day (BID) | ORAL | Status: DC
  Administered 2016-11-20 – 2016-11-21 (×2): 200 mg via ORAL
  Filled 2016-11-20 (×2): qty 2

## 2016-11-20 MED ORDER — BETAMETHASONE SOD PHOS & ACET 6 (3-3) MG/ML IJ SUSP
12.0000 mg | INTRAMUSCULAR | Status: DC
  Administered 2016-11-21: 12 mg via INTRAMUSCULAR
  Filled 2016-11-20: qty 2

## 2016-11-20 MED ORDER — ACETAMINOPHEN 325 MG PO TABS
650.0000 mg | ORAL_TABLET | ORAL | Status: DC | PRN
  Administered 2016-11-21 (×2): 650 mg via ORAL
  Filled 2016-11-20 (×2): qty 2

## 2016-11-20 MED ORDER — LACTATED RINGERS IV BOLUS (SEPSIS)
250.0000 mL | Freq: Once | INTRAVENOUS | Status: AC
Start: 2016-11-20 — End: 2016-11-21
  Administered 2016-11-21: 250 mL via INTRAVENOUS

## 2016-11-20 MED ORDER — ZOLPIDEM TARTRATE 5 MG PO TABS: 5.0000 mg | ORAL_TABLET | Freq: Every evening | ORAL | Status: DC | PRN

## 2016-11-20 MED ORDER — LABETALOL HCL 5 MG/ML IV SOLN
20.0000 mg | INTRAVENOUS | Status: DC | PRN
Start: 2016-11-20 — End: 2016-11-21

## 2016-11-20 MED ORDER — HYDRALAZINE HCL 20 MG/ML IJ SOLN: 10.0000 mg | Freq: Once | INTRAMUSCULAR | Status: DC | PRN

## 2016-11-20 MED ORDER — SODIUM CHLORIDE 0.9% FLUSH
3.0000 mL | Freq: Two times a day (BID) | INTRAVENOUS | Status: DC
  Administered 2016-11-21: 3 mL via INTRAVENOUS

## 2016-11-20 MED ORDER — CLOTRIMAZOLE 1 % VA CREA
1.0000 | TOPICAL_CREAM | Freq: Every day | VAGINAL | Status: DC
  Administered 2016-11-20: 1 via VAGINAL
  Filled 2016-11-20: qty 45

## 2016-11-20 MED ORDER — CALCIUM CARBONATE ANTACID 500 MG PO CHEW: 2.0000 | CHEWABLE_TABLET | ORAL | Status: DC | PRN

## 2016-11-20 MED ORDER — PRENATAL MULTIVITAMIN CH: 1.0000 | ORAL_TABLET | Freq: Every day | ORAL | Status: DC

## 2016-11-20 NOTE — MAU Provider Note (Signed)
History     CSN: [id number]  Arrival date and time: 11/20/16 1657   First Provider Initiated Contact with Patient 11/20/16 1752      Chief Complaint  Patient presents with  . Hypertension   HPI Michelle Holder Pasha Gadison is a 36 y.o. [phone] at [redacted]w[redacted]d who presents from office for BP evaluation. Patient has chronic hypertension & is currently taking labetalol 200 mg BID; last dose this morning. Patient was in office & had severe range BPs. Denies headache vision changes, CP, SOB, or epigastric pain. Reports some intermittent abdominal cramping that occurs 3-4 times per hour since yesterday. Rates pain 3/10. Has not treated. Denies n/v/d, constipation, dysuria, vaginal bleeding, or LOF. Positive fetal movement.   OB History    Gravida Para Term Preterm AB Living   5 4 3 1   4    SAB TAB Ectopic Multiple Live Births         0 4      Past Medical History:  Diagnosis Date  . AMA (advanced maternal age) multigravida 35+   . Bariatric surgery status   . Dysmenorrhea   . Hypertension   . Knee pain    right  . Obesity   . Thyroid disease    thyroid nodule  . Thyroid nodule   . Vitamin D deficiency     Past Surgical History:  Procedure Laterality Date  . CESAREAN SECTION    . LAPAROSCOPIC GASTRECTOMY     SILS    Family History  Problem Relation Age of Onset  . Down syndrome Brother     Social History  Substance Use Topics  . Smoking status: Never Smoker  . Smokeless tobacco: Never Used  . Alcohol use No    Allergies: No Known Allergies  Prescriptions Prior to Admission  Medication Sig Dispense Refill Last Dose  . metoCLOPramide (REGLAN) 10 MG tablet Take 1 tablet (10 mg total) by mouth every 6 (six) hours as needed for nausea (nausea/headache). (Patient not taking: Reported on 08/30/2016) 10 tablet 0 Not Taking at Unknown time  . metroNIDAZOLE (FLAGYL) 500 MG tablet Take 1 tablet (500 mg total) by mouth 2 (two) times daily. 14 tablet 0   . oxyCODONE-acetaminophen  (PERCOCET/ROXICET) 5-325 MG tablet Take 1 tablet by mouth every 6 (six) hours as needed for severe pain.   08/30/2016 at Unknown time  . Prenatal Vit-Fe Fumarate-FA (PRENATAL MULTIVITAMIN) TABS tablet Take 1 tablet by mouth daily at 12 noon. 30 tablet 12 08/30/2016 at Unknown time  . terconazole (TERAZOL 3) 0.8 % vaginal cream Place 1 applicator vaginally at bedtime. 20 g 0     Review of Systems  Constitutional: Negative.   Eyes: Negative.   Respiratory: Negative for shortness of breath.   Cardiovascular: Negative for chest pain and leg swelling.  Gastrointestinal: Positive for abdominal pain. Negative for constipation, diarrhea, nausea and vomiting.  Genitourinary: Negative.   Neurological: Negative.    Physical Exam   Blood pressure 162/89, pulse 69, temperature 98.8 F (37.1 C), temperature source Oral, resp. rate 18, weight 275 lb 4 oz (124.9 kg), last menstrual period 03/28/2016, currently breastfeeding.   Temp:  [98.8 F (37.1 C)] 98.8 F (37.1 C) (01/02 1704) Pulse Rate:  [62-74] 66 (01/02 1931) Resp:  [18] 18 (01/02 1704) BP: (149-168)/(86-106) 157/96 (01/02 1931) Weight:  [275 lb 4 oz (124.9 kg)] 275 lb 4 oz (124.9 kg) (01/02 1704)  Physical Exam  Nursing note and vitals reviewed. Constitutional: She is oriented to person, place,  and time. She appears well-developed and well-nourished. No distress.  HENT:  Head: Normocephalic and atraumatic.  Eyes: Conjunctivae are normal. Right eye exhibits no discharge. Left eye exhibits no discharge. No scleral icterus.  Neck: Normal range of motion.  Cardiovascular: Normal rate, regular rhythm and normal heart sounds.   No murmur heard. Respiratory: Effort normal and breath sounds normal. No respiratory distress. She has no wheezes.  GI: Soft. Bowel sounds are normal. There is no tenderness.  Neurological: She is alert and oriented to person, place, and time. She has normal reflexes.  No clonus  Skin: Skin is warm and dry. She is  not diaphoretic.  Psychiatric: She has a normal mood and affect. Her behavior is normal. Judgment and thought content normal.   Fetal Tracing:  Baseline: 145 Variability: moderate Accelerations: 10x10 Decelerations: none  Toco: none   MAU Course  Procedures Results for orders placed or performed during the hospital encounter of 11/20/16 (from the past 24 hour(s))  Protein / creatinine ratio, urine     Status: Abnormal   Collection Time: 11/20/16  5:22 PM  Result Value Ref Range   Creatinine, Urine 61.00 mg/dL   Total Protein, Urine 20 mg/dL   Protein Creatinine Ratio 0.33 (H) 0.00 - 0.15 mg/mg[Cre]  Urinalysis, Routine w reflex microscopic     Status: Abnormal   Collection Time: 11/20/16  5:22 PM  Result Value Ref Range   Color, Urine YELLOW YELLOW   APPearance CLEAR CLEAR   Specific Gravity, Urine 1.006 1.005 - 1.030   pH 6.0 5.0 - 8.0   Glucose, UA NEGATIVE NEGATIVE mg/dL   Hgb urine dipstick MODERATE (A) NEGATIVE   Bilirubin Urine NEGATIVE NEGATIVE   Ketones, ur NEGATIVE NEGATIVE mg/dL   Protein, ur 30 (A) NEGATIVE mg/dL   Nitrite NEGATIVE NEGATIVE   Leukocytes, UA TRACE (A) NEGATIVE   RBC / HPF 0-5 0 - 5 RBC/hpf   WBC, UA NONE SEEN 0 - 5 WBC/hpf   Bacteria, UA NONE SEEN NONE SEEN   Squamous Epithelial / LPF 0-5 (A) NONE SEEN   Mucous PRESENT   CBC     Status: Abnormal   Collection Time: 11/20/16  6:55 PM  Result Value Ref Range   WBC 5.6 4.0 - 10.5 K/uL   RBC 3.79 (L) 3.87 - 5.11 MIL/uL   Hemoglobin 12.2 12.0 - 15.0 g/dL   HCT 16.134.9 (L) 09.636.0 - 04.546.0 %   MCV 92.1 78.0 - 100.0 fL   MCH 32.2 26.0 - 34.0 pg   MCHC 35.0 30.0 - 36.0 g/dL   RDW 40.912.7 81.111.5 - 91.415.5 %   Platelets 212 150 - 400 K/uL  Comprehensive metabolic panel     Status: Abnormal   Collection Time: 11/20/16  6:55 PM  Result Value Ref Range   Sodium 134 (L) 135 - 145 mmol/L   Potassium 3.8 3.5 - 5.1 mmol/L   Chloride 106 101 - 111 mmol/L   CO2 22 22 - 32 mmol/L   Glucose, Bld 89 65 - 99 mg/dL    BUN 9 6 - 20 mg/dL   Creatinine, Ser 7.820.83 0.44 - 1.00 mg/dL   Calcium 8.5 (L) 8.9 - 10.3 mg/dL   Total Protein 6.2 (L) 6.5 - 8.1 g/dL   Albumin 3.1 (L) 3.5 - 5.0 g/dL   AST 18 15 - 41 U/L   ALT 14 14 - 54 U/L   Alkaline Phosphatase 87 38 - 126 U/L   Total Bilirubin 0.2 (L) 0.3 - 1.2  mg/dL   GFR calc non Af Amer >60 >60 mL/min   GFR calc Af Amer >60 >60 mL/min   Anion gap 6 5 - 15    MDM Category 1 fetal tracing -- no ctx on TOCO or palpated IV labetalol protocol ordered d/t severe range BPs x 3 --- no further severe range BPs so not initiated CBC, CMP, urine PCR S/w Dr. Mindi Slicker regarding BPs & labs. Will admit to antenatal for observation  Assessment and Plan  A: 1. Chronic hypertension with superimposed pre-eclampsia    P: Antenatal observation Monitor BPs every 4hrs Antenatal steroids IV labetalol protocol prn PO labetalol 200 mg BID  Judeth Horn 11/20/2016, 5:52 PM

## 2016-11-20 NOTE — MAU Note (Signed)
Sent from office, BP was up, has been going up.  Stomach has been cramping since yesterday.  Denies HA, visual changes, epigastric pain or increased swelling.

## 2016-11-20 NOTE — MAU Note (Addendum)
G5P4  !@ 30+[redacted] wksga. Previous c/s with successful VBAC on 4th child. Plans for TOLAC again with current pregnancy. Presents to triage for HTN. Denies symptoms of PIH. Denies LOF or bleeding. +FM noted on EFM. EFM applied. Will cont to monitor. Pt states took her HTN meds this a.m.   1745: Provider at bs going over POC. Pt agreeable to tx.   2000: Provider talking with patient about being transferred to ante for observation per MD orders.   2002: [facility] notified. Report status of pt given to RN Kerby Moorsenee Brown.   2015: pt taken to high risk OB via wheelchair to room 310

## 2016-11-20 NOTE — H&P (Addendum)
Michelle HellingLatoya M Azucena FallenCastro Holder is a 36 y.o. female [phone] at 4730 2/7wks presenting for management of elevated BP. Pt has chronic hypertension and has was recently increased on meds to 200mg  labetalol bid. In office today for her scheduled NST, pt reported generalized malaise and cramping.She denies headaches or blurry vision. NST was reactive. BPs were noted to be in severe range - 170-190/98-115 and did not improve with rest as at previous appts Pt sent to MAU for mgmt Pt improved with iv labetalol protocol. Pt still complains of cramping. No contractions on toco. FFN collected and sve done and notes closed cervix but chunky white discharge consistent with yeast infection  OB History    Gravida Para Term Preterm AB Living   5 4 3 1   4    SAB TAB Ectopic Multiple Live Births         0 4     Past Medical History:  Diagnosis Date  . AMA (advanced maternal age) multigravida 35+   . Bariatric surgery status   . Dysmenorrhea   . Hypertension   . Knee pain    right  . Obesity   . Thyroid disease    thyroid nodule  . Thyroid nodule   . Vitamin D deficiency    Past Surgical History:  Procedure Laterality Date  . CESAREAN SECTION    . LAPAROSCOPIC GASTRECTOMY     SILS   Family History: family history includes Down syndrome in her brother. Social History:  reports that she has never smoked. She has never used smokeless tobacco. She reports that she does not drink alcohol or use drugs.     Maternal Diabetes: No: panorama - wnl Genetic Screening: Normal Maternal Ultrasounds/Referrals: Normal Fetal Ultrasounds or other Referrals:  None Maternal Substance Abuse:  No Significant Maternal Medications:  Meds include: Other: labetalol 200mg  po bid Significant Maternal Lab Results:  None Other Comments:  None  Review of Systems  Constitutional: Positive for malaise/fatigue. Negative for chills, fever and weight loss.  HENT: Negative for tinnitus.   Eyes: Negative for blurred vision and double  vision.  Respiratory: Negative for shortness of breath.   Cardiovascular: Negative for chest pain, palpitations and leg swelling.  Gastrointestinal: Positive for abdominal pain. Negative for heartburn, nausea and vomiting.  Genitourinary: Negative for dysuria, frequency and urgency.  Musculoskeletal: Negative for back pain and myalgias.  Skin: Negative for itching and rash.  Neurological: Positive for tingling and headaches. Negative for dizziness.  Endo/Heme/Allergies: Does not bruise/bleed easily.  Psychiatric/Behavioral: Negative for depression, hallucinations, substance abuse and suicidal ideas.   Maternal Medical History:  Reason for admission: Nausea. Elevated bp and cramping  Contractions: Onset was 6-12 hours ago.   Frequency: irregular.   Perceived severity is mild.    Fetal activity: Perceived fetal activity is normal.   Last perceived fetal movement was within the past hour.    Prenatal complications: PIH and pre-eclampsia.   Prenatal Complications - Diabetes: none.      Blood pressure (!) 156/92, pulse 66, temperature 98.4 F (36.9 C), temperature source Oral, resp. rate 18, height 5\' 8"  (1.727 m), weight 275 lb (124.7 kg), last menstrual period 03/28/2016, SpO2 100 %, currently breastfeeding. Maternal Exam:  Abdomen: Patient reports the following abdominal tenderness: suprapubic.  Surgical scars: low transverse.   Introitus: Normal vulva. Vagina is positive for vaginal discharge (chunky white).  Pelvis: adequate for delivery.   Cervix: Cervix evaluated by digital exam.     Physical Exam  Constitutional: She is  oriented to person, place, and time. She appears well-developed and well-nourished.  HENT:  Head: Normocephalic.  Neck: Normal range of motion.  Cardiovascular: Normal rate.   Respiratory: Effort normal.  GI: Soft. There is tenderness in the suprapubic area.  Genitourinary: Vaginal discharge (chunky white) found.  Musculoskeletal: Normal range of  motion. She exhibits no edema.  Neurological: She is alert and oriented to person, place, and time.  Skin: Skin is warm.  Psychiatric: She has a normal mood and affect. Her behavior is normal. Judgment and thought content normal.    Prenatal labs: ABO, Rh:   Antibody:   Rubella:   RPR:    HBsAg:    HIV:    GBS:     Assessment/Plan: -[id number] at 20 2/[redacted]wks gestation with chtn with exacerbation into severe range; currently stable s/p iv labetalol -Monitor bp q 4 hours; initial protocol again if indicated -Change to labetalol 200mg  po tid -BMZ for in am and again in 24hours   for lung maturity in event of need for delivery over next month; consent obtained from pt -antifungal vaginal cream qhs -ivf hydration; 250mg  iv fluids -q shift monitoring -check ffn -reassess in am  [facility] Michelle Holder 11/20/2016, 9:28 PM

## 2016-11-21 ENCOUNTER — Encounter (HOSPITAL_COMMUNITY): Payer: Self-pay | Admitting: *Deleted

## 2016-11-21 DIAGNOSIS — O113 Pre-existing hypertension with pre-eclampsia, third trimester: Secondary | ICD-10-CM | POA: Diagnosis not present

## 2016-11-21 MED ORDER — BETAMETHASONE SOD PHOS & ACET 6 (3-3) MG/ML IJ SUSP: 12.0000 mg | INTRAMUSCULAR | 0 refills | Status: DC

## 2016-11-21 MED ORDER — CLOTRIMAZOLE 1 % VA CREA: 1.0000 | TOPICAL_CREAM | Freq: Every day | VAGINAL | 0 refills | Status: AC

## 2016-11-21 NOTE — Discharge Instructions (Signed)
Call if persistent headaches, blurry vision or general malaise

## 2016-11-21 NOTE — Discharge Summary (Signed)
OB Discharge Summary     Patient Name: Michelle Holder DOB: 11/04/1981 MRN: [id number]  Date of admission: 11/20/2016 Delivering MD: This patient has no babies on file.  Date of discharge: 11/21/2016  Admitting diagnosis: 30.2w elevated bp Intrauterine pregnancy: 3718w3d     Secondary diagnosis:  Active Problems:   Chronic hypertension with superimposed pre-eclampsia   Pre-existing essential htn comp pregnancy, third trimester  Additional problems: none     Discharge diagnosis: cHTN - stablized                                                                                                Post partum procedures:none   Hospital course:Pt was admitted for stabilization of BP and monitoring. FFN neg. BP stable overnight. Discharged to home with close outpatient follow up. Treated for yeast infection  Physical exam  Vitals:   11/21/16 0013 11/21/16 0425 11/21/16 0751 11/21/16 0928  BP: (!) 146/80 137/74 (!) 157/88 (!) 142/81  Pulse: 76 61 62 66  Resp: 18 16 18 17   Temp: 98.4 F (36.9 C) 98.2 F (36.8 C) 98.3 F (36.8 C)   TempSrc: Oral Oral Oral   SpO2: 99% 100%    Weight:      Height:       General: alert, cooperative and no distress Uterine Fundus: c/w gaDVT Evaluation: No evidence of DVT seen on physical exam. Labs: Lab Results  Component Value Date   WBC 5.6 11/20/2016   HGB 12.2 11/20/2016   HCT 34.9 (L) 11/20/2016   MCV 92.1 11/20/2016   PLT 212 11/20/2016   CMP Latest Ref Rng & Units 11/20/2016  Glucose 65 - 99 mg/dL 89  BUN 6 - 20 mg/dL 9  Creatinine 7.250.44 - 3.661.00 mg/dL 4.400.83  Sodium 347135 - 425145 mmol/L 134(L)  Potassium 3.5 - 5.1 mmol/L 3.8  Chloride 101 - 111 mmol/L 106  CO2 22 - 32 mmol/L 22  Calcium 8.9 - 10.3 mg/dL 9.5(G8.5(L)  Total Protein 6.5 - 8.1 g/dL 6.2(L)  Total Bilirubin 0.3 - 1.2 mg/dL 3.8(V0.2(L)  Alkaline Phos 38 - 126 U/L 87  AST 15 - 41 U/L 18  ALT 14 - 54 U/L 14    Discharge instruction: per After Visit Summary and "Baby and Me  Booklet".  After visit meds:  Allergies as of 11/21/2016   No Known Allergies     Medication List    TAKE these medications   betamethasone acetate-betamethasone sodium phosphate 6 (3-3) MG/ML injection Commonly known as:  CELESTONE Inject 2 mLs (12 mg total) into the muscle every 24 hours x 2 doses. Return to hospital 11/22/16 for next dose   clotrimazole 1 % vaginal cream Commonly known as:  GYNE-LOTRIMIN Place 1 Applicatorful vaginally at bedtime.   labetalol 200 MG tablet Commonly known as:  NORMODYNE Take 200 mg by mouth 2 (two) times daily.   prenatal multivitamin Tabs tablet Take 1 tablet by mouth daily at 12 noon.       Diet: low salt diet  Activity: Advance as tolerated. Pelvic rest for 6 weeks.   Outpatient follow up:2  days Follow up Appt:No future appointments. Follow up Visit:No Follow-up on file.  Disposition: home   11/21/2016 Edwinna Areola, DO

## 2016-11-21 NOTE — Progress Notes (Signed)
Patient ID: Michelle PlumeLatoya M Castro Holder, female   DOB: 03/28/1981, 36 y.o.   MRN: [id number] Pt doing well. Reports no headaches overnight. Cramping improved. Receiving am dose of labetalol now as well as BMZ VS - 137-157/74-88 EFM - cat 1 TOCO - no contractions SVE deferred ( was closed last night)  FFN - neg  A/P: [id number] at 30 3/[redacted]wks gestation with chtn with severe exacerbation - now stable         Will keep on labetalol 200mg  po bid         Second dose of BMZ tomorrow am         Pt to do another 24hr urine collection - hat/jug given         Keep appt on 1/5 for NST in office/weekly appts

## 2016-11-22 ENCOUNTER — Inpatient Hospital Stay (HOSPITAL_COMMUNITY)
Admission: AD | Admit: 2016-11-22 | Discharge: 2016-11-22 | Disposition: A | Discharge status: Home / Self Care | Payer: Medicaid Other | Source: Ambulatory Visit | Attending: Obstetrics and Gynecology | Admitting: Obstetrics and Gynecology

## 2016-11-22 DIAGNOSIS — Z3A30 30 weeks gestation of pregnancy: Secondary | ICD-10-CM | POA: Insufficient documentation

## 2016-11-22 DIAGNOSIS — O1493 Unspecified pre-eclampsia, third trimester: Secondary | ICD-10-CM | POA: Insufficient documentation

## 2016-11-22 DIAGNOSIS — O133 Gestational [pregnancy-induced] hypertension without significant proteinuria, third trimester: Secondary | ICD-10-CM

## 2016-11-22 MED ORDER — BETAMETHASONE SOD PHOS & ACET 6 (3-3) MG/ML IJ SUSP
12.0000 mg | Freq: Once | INTRAMUSCULAR | Status: AC
  Administered 2016-11-22: 12 mg via INTRAMUSCULAR
  Filled 2016-11-22: qty 2

## 2016-11-22 NOTE — MAU Note (Signed)
PT presents to MAU for repeat betamethasone injection. States she was discharged yesterday morning and was given her shot right before she left. Pt is currently collecting a 24 hour urine.Denies any headache of blurred vision.

## 2016-11-23 ENCOUNTER — Encounter (HOSPITAL_COMMUNITY): Payer: Self-pay

## 2016-11-23 ENCOUNTER — Observation Stay (HOSPITAL_COMMUNITY)
Admission: AD | Admit: 2016-11-23 | Discharge: 2016-11-24 | Disposition: A | Discharge status: Home / Self Care | Payer: Medicaid Other | Source: Ambulatory Visit | Attending: Obstetrics and Gynecology | Admitting: Obstetrics and Gynecology

## 2016-11-23 DIAGNOSIS — O10913 Unspecified pre-existing hypertension complicating pregnancy, third trimester: Secondary | ICD-10-CM | POA: Diagnosis not present

## 2016-11-23 DIAGNOSIS — R600 Localized edema: Secondary | ICD-10-CM | POA: Diagnosis not present

## 2016-11-23 DIAGNOSIS — Z3A30 30 weeks gestation of pregnancy: Secondary | ICD-10-CM | POA: Diagnosis not present

## 2016-11-23 DIAGNOSIS — O163 Unspecified maternal hypertension, third trimester: Secondary | ICD-10-CM | POA: Diagnosis present

## 2016-11-23 LAB — CBC
HEMATOCRIT: 36 % (ref 36.0–46.0)
Hemoglobin: 12.3 g/dL (ref 12.0–15.0)
MCH: 31.5 pg (ref 26.0–34.0)
MCHC: 34.2 g/dL (ref 30.0–36.0)
MCV: 92.3 fL (ref 78.0–100.0)
PLATELETS: 247 10*3/uL (ref 150–400)
RBC: 3.9 MIL/uL (ref 3.87–5.11)
RDW: 13.1 % (ref 11.5–15.5)
WBC: 11.1 10*3/uL — AB (ref 4.0–10.5)

## 2016-11-23 LAB — COMPREHENSIVE METABOLIC PANEL
ALBUMIN: 3.2 g/dL — AB (ref 3.5–5.0)
ALT: 17 U/L (ref 14–54)
AST: 21 U/L (ref 15–41)
Alkaline Phosphatase: 85 U/L (ref 38–126)
Anion gap: 8 (ref 5–15)
BUN: 13 mg/dL (ref 6–20)
CHLORIDE: 106 mmol/L (ref 101–111)
CO2: 22 mmol/L (ref 22–32)
CREATININE: 0.87 mg/dL (ref 0.44–1.00)
Calcium: 8.6 mg/dL — ABNORMAL LOW (ref 8.9–10.3)
GFR calc Af Amer: >60 mL/min (ref >60)
GFR calc non Af Amer: >60 mL/min (ref >60)
GLUCOSE: 64 mg/dL — AB (ref 65–99)
POTASSIUM: 3.6 mmol/L (ref 3.5–5.1)
SODIUM: 136 mmol/L (ref 135–145)
Total Bilirubin: 0.4 mg/dL (ref 0.3–1.2)
Total Protein: 6.5 g/dL (ref 6.5–8.1)

## 2016-11-23 LAB — PROTEIN / CREATININE RATIO, URINE
Creatinine, Urine: 156 mg/dL
PROTEIN CREATININE RATIO: 0.31 mg/mg{creat} — AB (ref 0.00–0.15)
Total Protein, Urine: 49 mg/dL

## 2016-11-23 LAB — TYPE AND SCREEN
ABO/RH(D): AB POS
ANTIBODY SCREEN: NEGATIVE

## 2016-11-23 MED ORDER — SODIUM CHLORIDE 0.9% FLUSH: 3.0000 mL | Freq: Two times a day (BID) | INTRAVENOUS | Status: DC

## 2016-11-23 MED ORDER — PRENATAL MULTIVITAMIN CH
1.0000 | ORAL_TABLET | Freq: Every day | ORAL | Status: DC
  Administered 2016-11-24: 1 via ORAL
  Filled 2016-11-23 (×2): qty 1

## 2016-11-23 MED ORDER — ACETAMINOPHEN 325 MG PO TABS
650.0000 mg | ORAL_TABLET | ORAL | Status: DC | PRN
  Administered 2016-11-23 – 2016-11-24 (×3): 650 mg via ORAL
  Filled 2016-11-23 (×2): qty 2

## 2016-11-23 MED ORDER — HYDRALAZINE HCL 20 MG/ML IJ SOLN
10.0000 mg | Freq: Once | INTRAMUSCULAR | Status: AC | PRN
Start: 2016-11-23 — End: 2016-11-23
  Administered 2016-11-23: 10 mg via INTRAVENOUS
  Filled 2016-11-23: qty 1

## 2016-11-23 MED ORDER — CALCIUM CARBONATE ANTACID 500 MG PO CHEW: 2.0000 | CHEWABLE_TABLET | ORAL | Status: DC | PRN

## 2016-11-23 MED ORDER — HYDRALAZINE HCL 20 MG/ML IJ SOLN: 5.0000 mg | INTRAMUSCULAR | Status: DC | PRN

## 2016-11-23 MED ORDER — SODIUM CHLORIDE 0.9% FLUSH: 3.0000 mL | INTRAVENOUS | Status: DC | PRN

## 2016-11-23 MED ORDER — ZOLPIDEM TARTRATE 5 MG PO TABS
5.0000 mg | ORAL_TABLET | Freq: Every evening | ORAL | Status: DC | PRN
  Administered 2016-11-23: 5 mg via ORAL
  Filled 2016-11-23: qty 1

## 2016-11-23 MED ORDER — CYCLOBENZAPRINE HCL 5 MG PO TABS
5.0000 mg | ORAL_TABLET | Freq: Once | ORAL | Status: AC
  Administered 2016-11-23: 5 mg via ORAL
  Filled 2016-11-23 (×2): qty 1

## 2016-11-23 MED ORDER — DOCUSATE SODIUM 100 MG PO CAPS
100.0000 mg | ORAL_CAPSULE | Freq: Every day | ORAL | Status: DC
Start: 2016-11-23 — End: 2016-11-24
  Administered 2016-11-23 – 2016-11-24 (×2): 100 mg via ORAL
  Filled 2016-11-23 (×4): qty 1

## 2016-11-23 MED ORDER — LABETALOL HCL 100 MG PO TABS
300.0000 mg | ORAL_TABLET | Freq: Two times a day (BID) | ORAL | Status: DC
  Administered 2016-11-23 – 2016-11-24 (×2): 300 mg via ORAL
  Filled 2016-11-23 (×2): qty 1

## 2016-11-23 MED ORDER — SODIUM CHLORIDE 0.9 % IV SOLN
250.0000 mL | INTRAVENOUS | Status: DC | PRN
  Administered 2016-11-23: 250 mL via INTRAVENOUS

## 2016-11-23 MED ORDER — CYCLOBENZAPRINE HCL 10 MG PO TABS
10.0000 mg | ORAL_TABLET | Freq: Three times a day (TID) | ORAL | Status: DC | PRN
  Administered 2016-11-24 (×2): 10 mg via ORAL
  Filled 2016-11-23 (×4): qty 1

## 2016-11-23 MED ORDER — LABETALOL HCL 5 MG/ML IV SOLN: 20.0000 mg | INTRAVENOUS | Status: DC | PRN

## 2016-11-23 MED ORDER — LABETALOL HCL 5 MG/ML IV SOLN
20.0000 mg | INTRAVENOUS | Status: DC | PRN
  Administered 2016-11-23: 20 mg via INTRAVENOUS
  Filled 2016-11-23: qty 4

## 2016-11-23 NOTE — H&P (Signed)
Chief Complaint  Patient presents with  . Hypertension   HPI Michelle Holder Michelle Holder is a 36 y.o. [phone] at [redacted]w[redacted]d who presents from the office for BP evluation. Patient has hx of CHTN; currently taking labetalol 200 mg BID, last dose this morning at 9 am. Was recently admitted to the hospital for severe range BPs (1/2-1/3) & received BMZ. Was discharged home once BPs stabalized.  Was in office today for NST/BPP & had elevated BP. Denies headache, vision changes, epigastric pain, n/v, CP, or SOB. Positive fetal movement.  NST in the office today was non-reactive, but had 8/8 BPP.           OB History    Gravida Para Term Preterm AB Living   5 4 3 1   4    SAB TAB Ectopic Multiple Live Births         0 4          Past Medical History:  Diagnosis Date  . AMA (advanced maternal age) multigravida 35+   . Bariatric surgery status   . Dysmenorrhea   . Hypertension   . Knee pain    right  . Obesity   . Thyroid disease    thyroid nodule  . Thyroid nodule   . Vitamin D deficiency          Past Surgical History:  Procedure Laterality Date  . CESAREAN SECTION    . LAPAROSCOPIC GASTRECTOMY     SILS         Family History  Problem Relation Age of Onset  . Down syndrome Brother         Social History  Substance Use Topics  . Smoking status: Never Smoker  . Smokeless tobacco: Never Used  . Alcohol use No    Allergies: No Known Allergies         Prescriptions Prior to Admission  Medication Sig Dispense Refill Last Dose  . clotrimazole (GYNE-LOTRIMIN) 1 % vaginal cream Place 1 Applicatorful vaginally at bedtime. 45 g 0 11/22/2016 at Unknown time  . labetalol (NORMODYNE) 200 MG tablet Take 200 mg by mouth 2 (two) times daily.   11/23/2016 at Unknown time  . Prenatal Vit-Fe Fumarate-FA (PRENATAL MULTIVITAMIN) TABS tablet Take 1 tablet by mouth daily at 12 noon. 30 tablet 12 11/23/2016 at Unknown time  . betamethasone acetate-betamethasone  sodium phosphate (CELESTONE) 6 (3-3) MG/ML injection Inject 2 mLs (12 mg total) into the muscle every 24 hours x 2 doses. Return to hospital 11/22/16 for next dose (Patient not taking: Reported on 11/23/2016) 5 mL 0 Completed Course at Unknown time    Review of Systems  Constitutional: Negative.   Respiratory: Negative for shortness of breath.   Cardiovascular: Negative for chest pain.  Gastrointestinal: Negative.   Genitourinary: Negative.   Musculoskeletal: Positive for back pain.  Neurological: Negative for dizziness and headaches.   Physical Exam   Blood pressure 160/90, pulse 63, temperature 97.9 F (36.6 C), temperature source Oral, resp. rate 20, weight 275 lb 12 oz (125.1 kg), last menstrual period 03/28/2016, currently breastfeeding.  Temp:  [97.9 F (36.6 C)] 97.9 F (36.6 C) (01/05 1430) Pulse Rate:  [55-80] 80 (01/05 1640) Resp:  [20] 20 (01/05 1430) BP: (157-179)/(88-111) 171/88 (01/05 1640) Weight:  [275 lb 12 oz (125.1 kg)] 275 lb 12 oz (125.1 kg) (01/05 1430)   Physical Exam  Nursing note and vitals reviewed. Constitutional: She is oriented to person, place, and time. She appears well-developed and well-nourished. No distress.  HENT:  Head: Normocephalic and atraumatic.  Eyes: Conjunctivae are normal. Right eye exhibits no discharge. Left eye exhibits no discharge. No scleral icterus.  Neck: Normal range of motion.  Cardiovascular: Normal rate, regular rhythm and normal heart sounds.   No murmur heard. Respiratory: Effort normal and breath sounds normal. No respiratory distress. She has no wheezes.  GI: Soft. Bowel sounds are normal. There is no tenderness.  Musculoskeletal: She exhibits edema (BLE 2+).  Neurological: She is alert and oriented to person, place, and time. She has normal reflexes.  No clonus  Skin: Skin is warm and dry. She is not diaphoretic.  Psychiatric: She has a normal mood and affect. Her behavior is normal. Judgment and thought content  normal.   Fetal Tracing:  Baseline: 155 Variability: minimal to moderate Accelerations: none Decelerations: none  Toco: none  MAU Course  Procedures Lab Results Last 24 Hours       Results for orders placed or performed during the hospital encounter of 11/23/16 (from the past 24 hour(s))  Protein / creatinine ratio, urine     Status: Abnormal   Collection Time: 11/23/16  2:32 PM  Result Value Ref Range   Creatinine, Urine 156.00 mg/dL   Total Protein, Urine 49 mg/dL   Protein Creatinine Ratio 0.31 (H) 0.00 - 0.15 mg/mg[Cre]  Comprehensive metabolic panel     Status: Abnormal   Collection Time: 11/23/16  3:04 PM  Result Value Ref Range   Sodium 136 135 - 145 mmol/L   Potassium 3.6 3.5 - 5.1 mmol/L   Chloride 106 101 - 111 mmol/L   CO2 22 22 - 32 mmol/L   Glucose, Bld 64 (L) 65 - 99 mg/dL   BUN 13 6 - 20 mg/dL   Creatinine, Ser 8.11 0.44 - 1.00 mg/dL   Calcium 8.6 (L) 8.9 - 10.3 mg/dL   Total Protein 6.5 6.5 - 8.1 g/dL   Albumin 3.2 (L) 3.5 - 5.0 g/dL   AST 21 15 - 41 U/L   ALT 17 14 - 54 U/L   Alkaline Phosphatase 85 38 - 126 U/L   Total Bilirubin 0.4 0.3 - 1.2 mg/dL   GFR calc non Af Amer >60 >60 mL/min   GFR calc Af Amer >60 >60 mL/min   Anion gap 8 5 - 15  CBC     Status: Abnormal   Collection Time: 11/23/16  3:04 PM  Result Value Ref Range   WBC 11.1 (H) 4.0 - 10.5 K/uL   RBC 3.90 3.87 - 5.11 MIL/uL   Hemoglobin 12.3 12.0 - 15.0 g/dL   HCT 91.4 78.2 - 95.6 %   MCV 92.3 78.0 - 100.0 fL   MCH 31.5 26.0 - 34.0 pg   MCHC 34.2 30.0 - 36.0 g/dL   RDW 21.3 08.6 - 57.8 %   Platelets 247 150 - 400 K/uL      MDM Labetalol protocol ordered for severe range BPs -- pt initially refused IV stating that she thought her BP would come down if we would just keep checking it. Pt agreed to IV after 4 severe range BPs Labetalol 20 mg IV given without effect; moved to hydralazine 10 mg d/t bradycardia Non-reactive tracing; pt repositioned  with improvement in variability Pt now complaining of "back spasms" while on the phone with her PCP -- flexeril 5 mg PO & hot pack to back S/w Dr. Jackelyn Knife -- will admit patient  Assessment and Plan  A; 1. Chronic hypertension with exacerbation during pregnancy, antepartum,  third trimester    P: Will admit for BP control, increase PO Labetalol to 300 mg bid, use IV hydralazine prn since had mild bradycardia with IV Labetalol.  She just completed steroid course.

## 2016-11-23 NOTE — MAU Note (Signed)
Urine in lab 

## 2016-11-23 NOTE — MAU Provider Note (Signed)
History     CSN: [id number]  Arrival date and time: 11/23/16 1423   First Provider Initiated Contact with Patient 11/23/16 1530      Chief Complaint  Patient presents with  . Hypertension   HPI Tinzlee Craker Clemence Lengyel is a 36 y.o. [phone] at [redacted]w[redacted]d who presents from the office for BP evluation. Patient has hx of CHTN; currently taking labetalol 200 mg BID, last dose this morning at 9 am. Was recently admitted to the hospital for severe range BPs (1/2-1/3) & received BMZ. Was discharged home once BPs stabalized.  Was in office today for NST/BPP & had elevated BP. Denies headache, vision changes, epigastric pain, n/v, CP, or SOB. Positive fetal movement.  States was told that baby "was lazy" during NST today so they did "an ultrasound & said everything looked fine".   OB History    Gravida Para Term Preterm AB Living   5 4 3 1   4    SAB TAB Ectopic Multiple Live Births         0 4      Past Medical History:  Diagnosis Date  . AMA (advanced maternal age) multigravida 35+   . Bariatric surgery status   . Dysmenorrhea   . Hypertension   . Knee pain    right  . Obesity   . Thyroid disease    thyroid nodule  . Thyroid nodule   . Vitamin D deficiency     Past Surgical History:  Procedure Laterality Date  . CESAREAN SECTION    . LAPAROSCOPIC GASTRECTOMY     SILS    Family History  Problem Relation Age of Onset  . Down syndrome Brother     Social History  Substance Use Topics  . Smoking status: Never Smoker  . Smokeless tobacco: Never Used  . Alcohol use No    Allergies: No Known Allergies  Prescriptions Prior to Admission  Medication Sig Dispense Refill Last Dose  . clotrimazole (GYNE-LOTRIMIN) 1 % vaginal cream Place 1 Applicatorful vaginally at bedtime. 45 g 0 11/22/2016 at Unknown time  . labetalol (NORMODYNE) 200 MG tablet Take 200 mg by mouth 2 (two) times daily.   11/23/2016 at Unknown time  . Prenatal Vit-Fe Fumarate-FA (PRENATAL MULTIVITAMIN) TABS tablet  Take 1 tablet by mouth daily at 12 noon. 30 tablet 12 11/23/2016 at Unknown time  . betamethasone acetate-betamethasone sodium phosphate (CELESTONE) 6 (3-3) MG/ML injection Inject 2 mLs (12 mg total) into the muscle every 24 hours x 2 doses. Return to hospital 11/22/16 for next dose (Patient not taking: Reported on 11/23/2016) 5 mL 0 Completed Course at Unknown time    Review of Systems  Constitutional: Negative.   Respiratory: Negative for shortness of breath.   Cardiovascular: Negative for chest pain.  Gastrointestinal: Negative.   Genitourinary: Negative.   Musculoskeletal: Positive for back pain.  Neurological: Negative for dizziness and headaches.   Physical Exam   Blood pressure 160/90, pulse 63, temperature 97.9 F (36.6 C), temperature source Oral, resp. rate 20, weight 275 lb 12 oz (125.1 kg), last menstrual period 03/28/2016, currently breastfeeding.  Temp:  [97.9 F (36.6 C)] 97.9 F (36.6 C) (01/05 1430) Pulse Rate:  [55-80] 80 (01/05 1640) Resp:  [20] 20 (01/05 1430) BP: (157-179)/(88-111) 171/88 (01/05 1640) Weight:  [275 lb 12 oz (125.1 kg)] 275 lb 12 oz (125.1 kg) (01/05 1430)   Physical Exam  Nursing note and vitals reviewed. Constitutional: She is oriented to person, place, and time. She appears  well-developed and well-nourished. No distress.  HENT:  Head: Normocephalic and atraumatic.  Eyes: Conjunctivae are normal. Right eye exhibits no discharge. Left eye exhibits no discharge. No scleral icterus.  Neck: Normal range of motion.  Cardiovascular: Normal rate, regular rhythm and normal heart sounds.   No murmur heard. Respiratory: Effort normal and breath sounds normal. No respiratory distress. She has no wheezes.  GI: Soft. Bowel sounds are normal. There is no tenderness.  Musculoskeletal: She exhibits edema (BLE 2+).  Neurological: She is alert and oriented to person, place, and time. She has normal reflexes.  No clonus  Skin: Skin is warm and dry. She is not  diaphoretic.  Psychiatric: She has a normal mood and affect. Her behavior is normal. Judgment and thought content normal.   Fetal Tracing:  Baseline: 155 Variability: minimal to moderate Accelerations: none Decelerations: none  Toco: none  MAU Course  Procedures Results for orders placed or performed during the hospital encounter of 11/23/16 (from the past 24 hour(s))  Protein / creatinine ratio, urine     Status: Abnormal   Collection Time: 11/23/16  2:32 PM  Result Value Ref Range   Creatinine, Urine 156.00 mg/dL   Total Protein, Urine 49 mg/dL   Protein Creatinine Ratio 0.31 (H) 0.00 - 0.15 mg/mg[Cre]  Comprehensive metabolic panel     Status: Abnormal   Collection Time: 11/23/16  3:04 PM  Result Value Ref Range   Sodium 136 135 - 145 mmol/L   Potassium 3.6 3.5 - 5.1 mmol/L   Chloride 106 101 - 111 mmol/L   CO2 22 22 - 32 mmol/L   Glucose, Bld 64 (L) 65 - 99 mg/dL   BUN 13 6 - 20 mg/dL   Creatinine, Ser 5.620.87 0.44 - 1.00 mg/dL   Calcium 8.6 (L) 8.9 - 10.3 mg/dL   Total Protein 6.5 6.5 - 8.1 g/dL   Albumin 3.2 (L) 3.5 - 5.0 g/dL   AST 21 15 - 41 U/L   ALT 17 14 - 54 U/L   Alkaline Phosphatase 85 38 - 126 U/L   Total Bilirubin 0.4 0.3 - 1.2 mg/dL   GFR calc non Af Amer >60 >60 mL/min   GFR calc Af Amer >60 >60 mL/min   Anion gap 8 5 - 15  CBC     Status: Abnormal   Collection Time: 11/23/16  3:04 PM  Result Value Ref Range   WBC 11.1 (H) 4.0 - 10.5 K/uL   RBC 3.90 3.87 - 5.11 MIL/uL   Hemoglobin 12.3 12.0 - 15.0 g/dL   HCT 13.036.0 86.536.0 - 78.446.0 %   MCV 92.3 78.0 - 100.0 fL   MCH 31.5 26.0 - 34.0 pg   MCHC 34.2 30.0 - 36.0 g/dL   RDW 69.613.1 29.511.5 - 28.415.5 %   Platelets 247 150 - 400 K/uL    MDM Labetalol protocol ordered for severe range BPs -- pt initially refused IV stating that she thought her BP would come down if we would just keep checking it. Pt agreed to IV after 4 severe range BPs Labetalol 20 mg IV given without effect; moved to hydralazine 10 mg d/t  bradycardia Non-reactive tracing; pt repositioned with improvement in variability Pt now complaining of "back spasms" while on the phone with her PCP -- flexeril 5 mg PO & hot pack to back S/w Dr. Jackelyn KnifeMeisinger -- will admit patient  Assessment and Plan  A; 1. Chronic hypertension with exacerbation during pregnancy, antepartum, third trimester    P: Dr.  Meisinger to place admission orders  Judeth Horn 11/23/2016, 3:29 PM

## 2016-11-23 NOTE — MAU Note (Signed)
Pt sent in for further eval of elevated BP.  Denies HA, visual changes, epigastric pain or increased swelling.  Pt is on medication, she says it is not a chronic problem.

## 2016-11-24 MED ORDER — LABETALOL HCL 200 MG PO TABS: 300.0000 mg | ORAL_TABLET | Freq: Two times a day (BID) | ORAL | 3 refills | Status: DC

## 2016-11-24 MED ORDER — CYCLOBENZAPRINE HCL 10 MG PO TABS: 10.0000 mg | ORAL_TABLET | Freq: Three times a day (TID) | ORAL | 0 refills | Status: DC | PRN

## 2016-11-24 NOTE — Progress Notes (Signed)
Pt has returned from walking in hallway x 15 min.

## 2016-11-24 NOTE — Progress Notes (Signed)
HD #2, [redacted]W[redacted]D, HTN Feels ok except for back spasms that started after first steroid shot earlier this week, no PIH sx Afeb, VSS, BP currently stable 130-140/70-90 FHT-140-150, min to mod variability, a few 10x10 accels, no decels Will continue on Labetalol 300 mg po bid for now, monitor BP today, eval for discharge this pm.  Continue Flexeril prn for back spasms.

## 2016-11-24 NOTE — Progress Notes (Signed)
BP stable, will d/c home

## 2016-11-24 NOTE — Discharge Instructions (Signed)
Call for headache, vision changes, abdominal pain or nausea/vomiting

## 2016-11-25 NOTE — Discharge Summary (Signed)
Physician Discharge Summary  Patient ID: Michelle PlumeLatoya M Castro Gallimore MRN: [id number] DOB/AGE: 38/07/1981 37 y.o.  Admit date: 11/23/2016 Discharge date: 11/24/2016  Admission Diagnoses:  IUP at 30+ weeks, HTN  Discharge Diagnoses: Same Active Problems:   Hypertension affecting pregnancy in third trimester   Discharged Condition: good  Hospital Course: Pt seen in the office and had BP 160-170/100-110.  She was already on Labetalol 200 mg bid and had been in the hospital earlier this week for BP control and received steroids.  On this visit, in MAU, BP again elevated and required IV Labetalol and IV hydralazine to control BP.  Labs normal, UPC 0.31.  She was admitted overnight for BP control.  Her PO Labetalol was increased to 300 mg bid.  Her BP remained well controlled throughout the day on 1-6, she felt fine except for some back spasms, she was felt to be stable for discharge home with close outpatient follow-up.   Discharge Exam: Blood pressure (!) 151/99, pulse 90, temperature 98 F (36.7 C), temperature source Oral, resp. rate 20, height 5\' 8"  (1.727 m), weight 124.7 kg (275 lb), last menstrual period 03/28/2016, SpO2 99 %, currently breastfeeding. General appearance: alert  Disposition: 01-Home or Self Care  Discharge Instructions    Discharge activity:  No Restrictions    Complete by:  As directed    Discharge diet:  No restrictions    Complete by:  As directed    Fetal Kick Count:  Lie on our left side for one hour after a meal, and count the number of times your baby kicks.  If it is less than 5 times, get up, move around and drink some juice.  Repeat the test 30 minutes later.  If it is still less than 5 kicks in an hour, notify your doctor.    Complete by:  As directed    No sexual activity restrictions    Complete by:  As directed      Allergies as of 11/24/2016   No Known Allergies     Medication List    STOP taking these medications   betamethasone  acetate-betamethasone sodium phosphate 6 (3-3) MG/ML injection Commonly known as:  CELESTONE     TAKE these medications   clotrimazole 1 % vaginal cream Commonly known as:  GYNE-LOTRIMIN Place 1 Applicatorful vaginally at bedtime.   cyclobenzaprine 10 MG tablet Commonly known as:  FLEXERIL Take 1 tablet (10 mg total) by mouth 3 (three) times daily as needed for muscle spasms.   labetalol 200 MG tablet Commonly known as:  NORMODYNE Take 1.5 tablets (300 mg total) by mouth 2 (two) times daily. What changed:  how much to take   prenatal multivitamin Tabs tablet Take 1 tablet by mouth daily at 12 noon.      Follow-up Information    [facility] ConcordBanga, DO. Schedule an appointment as soon as possible for a visit in 3 day(s).   Specialty:  Obstetrics and Gynecology Contact information: [address] [place] [address] [phone]           Signed: Zenaida NieceMEISINGER,Cornie Herrington D 11/25/2016, 7:18 AM

## 2016-11-27 ENCOUNTER — Encounter (HOSPITAL_COMMUNITY): Payer: Self-pay | Admitting: *Deleted

## 2016-11-27 ENCOUNTER — Inpatient Hospital Stay (HOSPITAL_COMMUNITY): Payer: Medicaid Other

## 2016-11-27 ENCOUNTER — Inpatient Hospital Stay (HOSPITAL_COMMUNITY)
Admission: AD | Admit: 2016-11-27 | Discharge: 2016-12-06 | DRG: 766 | Disposition: A | Discharge status: Home / Self Care | Payer: Medicaid Other | Source: Ambulatory Visit | Attending: Obstetrics and Gynecology | Admitting: Obstetrics and Gynecology

## 2016-11-27 DIAGNOSIS — O119 Pre-existing hypertension with pre-eclampsia, unspecified trimester: Secondary | ICD-10-CM

## 2016-11-27 DIAGNOSIS — O4593 Premature separation of placenta, unspecified, third trimester: Secondary | ICD-10-CM | POA: Diagnosis present

## 2016-11-27 DIAGNOSIS — O1002 Pre-existing essential hypertension complicating childbirth: Secondary | ICD-10-CM | POA: Diagnosis present

## 2016-11-27 DIAGNOSIS — O288 Other abnormal findings on antenatal screening of mother: Secondary | ICD-10-CM | POA: Diagnosis not present

## 2016-11-27 DIAGNOSIS — O113 Pre-existing hypertension with pre-eclampsia, third trimester: Secondary | ICD-10-CM | POA: Diagnosis not present

## 2016-11-27 DIAGNOSIS — O114 Pre-existing hypertension with pre-eclampsia, complicating childbirth: Principal | ICD-10-CM | POA: Diagnosis present

## 2016-11-27 DIAGNOSIS — O34211 Maternal care for low transverse scar from previous cesarean delivery: Secondary | ICD-10-CM | POA: Diagnosis present

## 2016-11-27 DIAGNOSIS — Z98891 History of uterine scar from previous surgery: Secondary | ICD-10-CM

## 2016-11-27 DIAGNOSIS — O99843 Bariatric surgery status complicating pregnancy, third trimester: Secondary | ICD-10-CM

## 2016-11-27 DIAGNOSIS — O99844 Bariatric surgery status complicating childbirth: Secondary | ICD-10-CM | POA: Diagnosis present

## 2016-11-27 DIAGNOSIS — Z3A31 31 weeks gestation of pregnancy: Secondary | ICD-10-CM

## 2016-11-27 DIAGNOSIS — Z8751 Personal history of pre-term labor: Secondary | ICD-10-CM

## 2016-11-27 DIAGNOSIS — O133 Gestational [pregnancy-induced] hypertension without significant proteinuria, third trimester: Secondary | ICD-10-CM | POA: Diagnosis present

## 2016-11-27 DIAGNOSIS — O368130 Decreased fetal movements, third trimester, not applicable or unspecified: Secondary | ICD-10-CM | POA: Diagnosis present

## 2016-11-27 DIAGNOSIS — O09523 Supervision of elderly multigravida, third trimester: Secondary | ICD-10-CM

## 2016-11-27 DIAGNOSIS — O368390 Maternal care for abnormalities of the fetal heart rate or rhythm, unspecified trimester, not applicable or unspecified: Secondary | ICD-10-CM

## 2016-11-27 DIAGNOSIS — Z3A32 32 weeks gestation of pregnancy: Secondary | ICD-10-CM

## 2016-11-27 DIAGNOSIS — O10013 Pre-existing essential hypertension complicating pregnancy, third trimester: Secondary | ICD-10-CM | POA: Diagnosis present

## 2016-11-27 HISTORY — DX: History of uterine scar from previous surgery: Z98.891

## 2016-11-27 LAB — COMPREHENSIVE METABOLIC PANEL
ALBUMIN: 3.3 g/dL — AB (ref 3.5–5.0)
ALT: 16 U/L (ref 14–54)
ANION GAP: 7 (ref 5–15)
AST: 19 U/L (ref 15–41)
Alkaline Phosphatase: 95 U/L (ref 38–126)
BUN: 12 mg/dL (ref 6–20)
CALCIUM: 8.7 mg/dL — AB (ref 8.9–10.3)
CHLORIDE: 103 mmol/L (ref 101–111)
CO2: 23 mmol/L (ref 22–32)
CREATININE: 0.83 mg/dL (ref 0.44–1.00)
GFR calc Af Amer: >60 mL/min (ref >60)
GFR calc non Af Amer: >60 mL/min (ref >60)
Glucose, Bld: 74 mg/dL (ref 65–99)
POTASSIUM: 4.5 mmol/L (ref 3.5–5.1)
Sodium: 133 mmol/L — ABNORMAL LOW (ref 135–145)
TOTAL PROTEIN: 6.1 g/dL — AB (ref 6.5–8.1)
Total Bilirubin: <0.1 mg/dL — ABNORMAL LOW (ref 0.3–1.2)

## 2016-11-27 LAB — CBC
HCT: 37.6 % (ref 36.0–46.0)
HEMOGLOBIN: 13 g/dL (ref 12.0–15.0)
MCH: 31.6 pg (ref 26.0–34.0)
MCHC: 34.6 g/dL (ref 30.0–36.0)
MCV: 91.3 fL (ref 78.0–100.0)
Platelets: 240 10*3/uL (ref 150–400)
RBC: 4.12 MIL/uL (ref 3.87–5.11)
RDW: 12.8 % (ref 11.5–15.5)
WBC: 7.3 10*3/uL (ref 4.0–10.5)

## 2016-11-27 LAB — TYPE AND SCREEN
ABO/RH(D): AB POS
Antibody Screen: NEGATIVE

## 2016-11-27 LAB — PROTEIN / CREATININE RATIO, URINE
CREATININE, URINE: 128 mg/dL
Protein Creatinine Ratio: 0.41 mg/mg{Cre} — ABNORMAL HIGH (ref 0.00–0.15)
TOTAL PROTEIN, URINE: 52 mg/dL

## 2016-11-27 MED ORDER — ZOLPIDEM TARTRATE 5 MG PO TABS
5.0000 mg | ORAL_TABLET | Freq: Every evening | ORAL | Status: DC | PRN
  Administered 2016-11-29 – 2016-11-30 (×2): 5 mg via ORAL
  Filled 2016-11-27 (×2): qty 1

## 2016-11-27 MED ORDER — DOCUSATE SODIUM 100 MG PO CAPS
100.0000 mg | ORAL_CAPSULE | Freq: Every day | ORAL | Status: DC
  Administered 2016-11-28 – 2016-12-03 (×6): 100 mg via ORAL
  Filled 2016-11-27 (×6): qty 1

## 2016-11-27 MED ORDER — HYDRALAZINE HCL 20 MG/ML IJ SOLN
10.0000 mg | Freq: Once | INTRAMUSCULAR | Status: AC | PRN
  Administered 2016-11-27: 10 mg via INTRAVENOUS
  Filled 2016-11-27: qty 1

## 2016-11-27 MED ORDER — CALCIUM CARBONATE ANTACID 500 MG PO CHEW
2.0000 | CHEWABLE_TABLET | ORAL | Status: DC | PRN
Start: 2016-11-27 — End: 2016-12-03

## 2016-11-27 MED ORDER — ACETAMINOPHEN 500 MG PO TABS
1000.0000 mg | ORAL_TABLET | Freq: Four times a day (QID) | ORAL | Status: DC | PRN
  Administered 2016-11-27 – 2016-11-28 (×5): 1000 mg via ORAL
  Filled 2016-11-27 (×5): qty 2

## 2016-11-27 MED ORDER — LABETALOL HCL 5 MG/ML IV SOLN
20.0000 mg | INTRAVENOUS | Status: AC | PRN
  Administered 2016-11-27: 40 mg via INTRAVENOUS
  Administered 2016-11-27: 20 mg via INTRAVENOUS
  Administered 2016-11-27: 80 mg via INTRAVENOUS
  Filled 2016-11-27: qty 16
  Filled 2016-11-27 (×3): qty 4

## 2016-11-27 MED ORDER — PRENATAL MULTIVITAMIN CH
1.0000 | ORAL_TABLET | Freq: Every day | ORAL | Status: DC
  Administered 2016-11-28 – 2016-12-03 (×6): 1 via ORAL
  Filled 2016-11-27 (×6): qty 1

## 2016-11-27 MED ORDER — CYCLOBENZAPRINE HCL 10 MG PO TABS
10.0000 mg | ORAL_TABLET | Freq: Three times a day (TID) | ORAL | Status: DC | PRN
  Administered 2016-11-28: 10 mg via ORAL
  Filled 2016-11-27 (×2): qty 1

## 2016-11-27 MED ORDER — LACTATED RINGERS IV SOLN
INTRAVENOUS | Status: DC
  Administered 2016-11-27 – 2016-11-28 (×3): via INTRAVENOUS

## 2016-11-27 MED ORDER — LABETALOL HCL 200 MG PO TABS
300.0000 mg | ORAL_TABLET | Freq: Two times a day (BID) | ORAL | Status: DC
  Administered 2016-11-28: 300 mg via ORAL
  Filled 2016-11-27: qty 1

## 2016-11-27 MED ORDER — ACETAMINOPHEN 325 MG PO TABS: 650.0000 mg | ORAL_TABLET | ORAL | Status: DC | PRN

## 2016-11-27 MED ORDER — NIFEDIPINE ER OSMOTIC RELEASE 30 MG PO TB24
30.0000 mg | ORAL_TABLET | Freq: Two times a day (BID) | ORAL | Status: DC
  Administered 2016-11-27 – 2016-11-28 (×2): 30 mg via ORAL
  Filled 2016-11-27 (×2): qty 1

## 2016-11-27 NOTE — MAU Note (Signed)
Pt presents to MAU after being evaluated by Dr Ellyn HackBovard today in the office for increase in blood pressure. Pt reports mild headache. Was discharged on Saturday for blood pressures

## 2016-11-27 NOTE — MAU Provider Note (Signed)
History     CSN: [id number]  Arrival date and time: 11/27/16 1433   None     Chief Complaint  Patient presents with  . Hypertension   G5P3104 @31 .2 weeks sent from office for elevated BP. She has hx of CHTN and was admitted 4 days ago. She reports frontal HA since Labetalol dose was increased at that time. The HA does respond to Tylenol. She has not taken any since last night. She denies visual changes and epigastric pain and visual disturbances. She reports +FM. No VB, LOF, or ctx.    OB History    Gravida Para Term Preterm AB Living   5 4 3 1   4    SAB TAB Ectopic Multiple Live Births         0 4      Past Medical History:  Diagnosis Date  . AMA (advanced maternal age) multigravida 35+   . Bariatric surgery status   . Dysmenorrhea   . Hypertension   . Knee pain    right  . Obesity   . Thyroid disease    thyroid nodule  . Thyroid nodule   . Vitamin D deficiency     Past Surgical History:  Procedure Laterality Date  . CESAREAN SECTION    . LAPAROSCOPIC GASTRECTOMY     SILS    Family History  Problem Relation Age of Onset  . Down syndrome Brother     Social History  Substance Use Topics  . Smoking status: Never Smoker  . Smokeless tobacco: Never Used  . Alcohol use No    Allergies: No Known Allergies  Prescriptions Prior to Admission  Medication Sig Dispense Refill Last Dose  . cyclobenzaprine (FLEXERIL) 10 MG tablet Take 1 tablet (10 mg total) by mouth 3 (three) times daily as needed for muscle spasms. 30 tablet 0 11/26/2016 at Unknown time  . labetalol (NORMODYNE) 200 MG tablet Take 1.5 tablets (300 mg total) by mouth 2 (two) times daily. 45 tablet 3 11/27/2016 at Unknown time  . Prenatal Vit-Fe Fumarate-FA (PRENATAL MULTIVITAMIN) TABS tablet Take 1 tablet by mouth daily at 12 noon. 30 tablet 12 11/27/2016 at Unknown time    Review of Systems  Constitutional: Negative.   Eyes: Negative for visual disturbance.  Gastrointestinal: Negative.    Neurological: Positive for headaches.   Physical Exam   Blood pressure 164/92, pulse 74, temperature 98 F (36.7 C), resp. rate 18, last menstrual period 03/28/2016, currently breastfeeding.  Physical Exam  Constitutional: She is oriented to person, place, and time. She appears well-developed and well-nourished. No distress.  HENT:  Head: Normocephalic.  Neck: Normal range of motion. Neck supple.  Cardiovascular: Normal rate.   Respiratory: Effort normal.  GI: Soft. She exhibits no distension. There is no tenderness.  gravid  Musculoskeletal: Normal range of motion. She exhibits no edema.  Neurological: She is alert and oriented to person, place, and time.  Skin: Skin is warm and dry.  Psychiatric: She has a normal mood and affect.   EFM: 155 bpm, mod variability, no accels, no decels Toco: none  Results for orders placed or performed during the hospital encounter of 11/27/16 (from the past 24 hour(s))  Protein / creatinine ratio, urine     Status: Abnormal   Collection Time: 11/27/16  2:45 PM  Result Value Ref Range   Creatinine, Urine 128.00 mg/dL   Total Protein, Urine 52 mg/dL   Protein Creatinine Ratio 0.41 (H) 0.00 - 0.15 mg/mg[Cre]  CBC  Status: None   Collection Time: 11/27/16  3:40 PM  Result Value Ref Range   WBC 7.3 4.0 - 10.5 K/uL   RBC 4.12 3.87 - 5.11 MIL/uL   Hemoglobin 13.0 12.0 - 15.0 g/dL   HCT 47.837.6 29.536.0 - 62.146.0 %   MCV 91.3 78.0 - 100.0 fL   MCH 31.6 26.0 - 34.0 pg   MCHC 34.6 30.0 - 36.0 g/dL   RDW 30.812.8 65.711.5 - 84.615.5 %   Platelets 240 150 - 400 K/uL  Comprehensive metabolic panel     Status: Abnormal   Collection Time: 11/27/16  3:40 PM  Result Value Ref Range   Sodium 133 (L) 135 - 145 mmol/L   Potassium 4.5 3.5 - 5.1 mmol/L   Chloride 103 101 - 111 mmol/L   CO2 23 22 - 32 mmol/L   Glucose, Bld 74 65 - 99 mg/dL   BUN 12 6 - 20 mg/dL   Creatinine, Ser 9.620.83 0.44 - 1.00 mg/dL   Calcium 8.7 (L) 8.9 - 10.3 mg/dL   Total Protein 6.1 (L) 6.5 - 8.1  g/dL   Albumin 3.3 (L) 3.5 - 5.0 g/dL   AST 19 15 - 41 U/L   ALT 16 14 - 54 U/L   Alkaline Phosphatase 95 38 - 126 U/L   Total Bilirubin <0.1 (L) 0.3 - 1.2 mg/dL   GFR calc non Af Amer >60 >60 mL/min   GFR calc Af Amer >60 >60 mL/min   Anion gap 7 5 - 15   MAU Course  Procedures HTN protocol BPP Tylenol 1gm po  MDM Labs and BPP ordered and reviewed. BPP 8/8, nml AFI. Presentation, clinical findings, and plan discussed with Dr. Ellyn HackBovard. Plan for admit.   Assessment and Plan   1. [redacted] weeks gestation of pregnancy   2. Non-reactive NST (non-stress test)   3. Chronic hypertension with superimposed pre-eclampsia    Admit to Antepartum unit Management per Dr. Wyvonna PlumBovard  Jerrett Baldinger, CNM 11/27/2016, 3:42 PM

## 2016-11-27 NOTE — H&P (Signed)
Michelle HellingLatoya M Holder Michelle Holder is a 36 y.o. female [phone] at 31+ with poorly controlled HTN/? SIPE.  Recent admissions for BP control.  Presented to office with BP 160/100+ - received labetalol protocol.  Have serially increased labetalol, will change to procardia XL 30mg .  Has had multiple 24hr urines and Pr/Cr ration.  To this point at beginning of preg = 383mg  protein, Pr/Cr ratio WNL, on admission PR/Cr = .410.  Pt also AMA, h/o gastric bypass.    OB History    Gravida Para Term Preterm AB Living   5 4 3 1   4    SAB TAB Ectopic Multiple Live Births         0 4    SVD x 2 at 40 wk IOL at 34 wk for PIH, failed - LTCS SVD at 40 wk G5 present  No abn pap, last 01/07/15, HR HPV neg No STDs  Past Medical History:  Diagnosis Date  . AMA (advanced maternal age) multigravida 35+   . Bariatric surgery status   . Dysmenorrhea   . Hypertension   . Knee pain    right  . Obesity   . Thyroid disease    thyroid nodule  . Thyroid nodule   . Vitamin D deficiency    Past Surgical History:  Procedure Laterality Date  . CESAREAN SECTION    . LAPAROSCOPIC GASTRECTOMY     SILS   Family History: family history includes Down syndrome in her brother., m heart murmur Social History:  reports that she has never smoked. She has never used smokeless tobacco. She reports that she does not drink alcohol or use drugs. teacher - part-time, married Meds fiorcet, cyclobenzaprine, labetalol, percocet and PNV     Maternal Diabetes: No Genetic Screening: Normal Maternal Ultrasounds/Referrals: Normal Fetal Ultrasounds or other Referrals:  None Maternal Substance Abuse:  No Significant Maternal Medications:  Meds include: Other:  Significant Maternal Lab Results:  None Other Comments:  PIH will change labetalol to procardia  Review of Systems  Constitutional: Positive for malaise/fatigue.  HENT: Negative.   Eyes: Negative.   Respiratory: Negative.   Cardiovascular: Negative.   Gastrointestinal:  Negative.   Genitourinary: Negative.   Musculoskeletal: Negative.   Skin: Negative.   Neurological: Positive for headaches.  Psychiatric/Behavioral: Negative.    Maternal Medical History:  Contractions: Frequency: irregular.   Perceived severity is mild.    Fetal activity: Perceived fetal activity is normal.    Prenatal complications: PIH.   Prenatal Complications - Diabetes: none.      Blood pressure 160/92, pulse 71, temperature 98 F (36.7 C), resp. rate 18, last menstrual period 03/28/2016, currently breastfeeding. Maternal Exam:  Uterine Assessment: Contraction frequency is irregular.   Abdomen: Patient reports no abdominal tenderness. Surgical scars: low transverse.   Fundal height is appropriate for gestation.   Estimated fetal weight is 2-3#.   Fetal presentation: vertex  Introitus: Normal vulva. Normal vagina.  Pelvis: adequate for delivery.      Physical Exam  Constitutional: She is oriented to person, place, and time. She appears well-developed and well-nourished.  HENT:  Head: Normocephalic and atraumatic.  Cardiovascular: Normal rate and regular rhythm.   Respiratory: Effort normal. No respiratory distress. She has no wheezes.  GI: Soft. Bowel sounds are normal. She exhibits no distension. There is no tenderness.  Musculoskeletal: Normal range of motion.  Neurological: She is alert and oriented to person, place, and time.  Skin: Skin is warm and dry.  Psychiatric: She has a  normal mood and affect. Her behavior is normal.    Prenatal labs: ABO, Rh: --/--/AB POS (01/05 1725) Antibody: NEG (01/05 1725) Rubella:  nonimmune RPR:   NR HBsAg:   neg HIV:   neg GBS:   unknown  Panorama - low risk female Hgb electro WNL glucola 43  [place] nl anat, female, post plac  Assessment/Plan: Admit to Ante - work for BP control Q shift NST Procardia XL 30 bid Labetalol protocol   Bovard-Stuckert, Keyundra Fant 11/27/2016, 6:18 PM

## 2016-11-27 NOTE — MAU Note (Signed)
Urine in lab 

## 2016-11-28 LAB — CBC
HEMATOCRIT: 37.6 % (ref 36.0–46.0)
HEMOGLOBIN: 13 g/dL (ref 12.0–15.0)
MCH: 31.7 pg (ref 26.0–34.0)
MCHC: 34.6 g/dL (ref 30.0–36.0)
MCV: 91.7 fL (ref 78.0–100.0)
PLATELETS: 244 10*3/uL (ref 150–400)
RBC: 4.1 MIL/uL (ref 3.87–5.11)
RDW: 12.7 % (ref 11.5–15.5)
WBC: 6 10*3/uL (ref 4.0–10.5)

## 2016-11-28 LAB — COMPREHENSIVE METABOLIC PANEL
ALBUMIN: 3.2 g/dL — AB (ref 3.5–5.0)
ALT: 16 U/L (ref 14–54)
ANION GAP: 5 (ref 5–15)
AST: 19 U/L (ref 15–41)
Alkaline Phosphatase: 95 U/L (ref 38–126)
BUN: 9 mg/dL (ref 6–20)
CO2: 25 mmol/L (ref 22–32)
Calcium: 8.6 mg/dL — ABNORMAL LOW (ref 8.9–10.3)
Chloride: 103 mmol/L (ref 101–111)
Creatinine, Ser: 0.74 mg/dL (ref 0.44–1.00)
GFR calc Af Amer: >60 mL/min (ref >60)
GFR calc non Af Amer: >60 mL/min (ref >60)
GLUCOSE: 86 mg/dL (ref 65–99)
POTASSIUM: 4.3 mmol/L (ref 3.5–5.1)
Sodium: 133 mmol/L — ABNORMAL LOW (ref 135–145)
Total Bilirubin: 0.5 mg/dL (ref 0.3–1.2)
Total Protein: 6.1 g/dL — ABNORMAL LOW (ref 6.5–8.1)

## 2016-11-28 MED ORDER — SODIUM CHLORIDE 0.9% FLUSH: 3.0000 mL | INTRAVENOUS | Status: DC | PRN

## 2016-11-28 MED ORDER — OXYCODONE HCL 5 MG PO TABS
5.0000 mg | ORAL_TABLET | Freq: Once | ORAL | Status: AC
  Administered 2016-11-28: 5 mg via ORAL
  Filled 2016-11-28: qty 1

## 2016-11-28 MED ORDER — OXYCODONE HCL 5 MG PO TABS
5.0000 mg | ORAL_TABLET | ORAL | Status: AC
  Administered 2016-11-28: 5 mg via ORAL
  Filled 2016-11-28: qty 1

## 2016-11-28 MED ORDER — LABETALOL HCL 200 MG PO TABS
300.0000 mg | ORAL_TABLET | Freq: Three times a day (TID) | ORAL | Status: DC
  Administered 2016-11-28 – 2016-12-01 (×8): 300 mg via ORAL
  Filled 2016-11-28 (×8): qty 1

## 2016-11-28 MED ORDER — SODIUM CHLORIDE 0.9% FLUSH
3.0000 mL | Freq: Two times a day (BID) | INTRAVENOUS | Status: DC
  Administered 2016-11-29 – 2016-11-30 (×3): 3 mL via INTRAVENOUS

## 2016-11-28 MED ORDER — SALINE SPRAY 0.65 % NA SOLN
1.0000 | NASAL | Status: DC | PRN
  Administered 2016-11-28: 1 via NASAL
  Filled 2016-11-28: qty 44

## 2016-11-28 MED ORDER — ONDANSETRON HCL 4 MG/2ML IJ SOLN
4.0000 mg | Freq: Once | INTRAMUSCULAR | Status: AC
  Administered 2016-11-28: 4 mg via INTRAVENOUS
  Filled 2016-11-28: qty 2

## 2016-11-28 NOTE — Progress Notes (Signed)
RN to room with patient vomiting, MD notified and orders received

## 2016-11-28 NOTE — Progress Notes (Addendum)
Assumed care of pt after receiving report from previous shift nurse. Pt states still has the ha but improving. States the procardia caused the ha and since off it, improved and feeling much better. 2/10 pain that's constant. Pain med given.   SCD's and ted stockings initiated per order. Tech notified to obtain these items and put on pt after pt showers. Shower items provided. Instructed pt to take quick shower and back to bed per order for bedrest with bathr and shower only. Pt verbalizes understanding to call once shower done.   Initiated u/o orders. Explained to pt the importance of U/O. Hat placed in the toilet. Instructed pt to call when voided so nurse may keep tract of u/o per order. Pt verbalizes understanding.  2020: Dr. Senaida Oresichardson notified. Made aware of Bp readings plus the previous day shift Bps and ha. Orders received to notify only of if systolic bp 160 or over and diastolic 110 or over. Clarified order for SCD's, (place), ted hose d/c, and keep tract of I/O's per initial orders.   1950: EFM applied.   2110: EFM adjusted. Pt sitting up to eat. Will put back on after eating to obtain better tracing.   2134: pt finished eating dinner. EFM applied.   0400: Tech took bp on arm patient is laying on and did for the 0407 one as well. BP retaken on other arm 139/97. Pt denies symptoms of PIH.

## 2016-11-28 NOTE — Progress Notes (Signed)
Patient ID: Bing PlumeLatoya M Castro Gallimore, female   DOB: 11/29/1980, 36 y.o.   MRN: [id number] HD #2 CHTN with poorly controlled BP in severe range/possible superimposed preeclampsia  Pt states has HA this AM that is worse than prior HA's.  She reports always has some mild HA, but have been increasing in frequency and intensity over last week.  No abdominal pain. +FM.    BP 135/86-172/95 Other vss Category 1 FHR this AM  GBS cx pending CMP repeated and  WNL, CBC pending  Pt states completed a 24 hour urine and turned in to office 11/23/16 but that had not been run and office was trying to get it done yesterday.  Prot:creat ratio .41  D/w pt concern for developing preeclampsia and that I would like to get her HA controlled.  She admits to some sinus pressure and also with the new start of procardia could be contributing to it.   Procardia XL 30mg  po BID (started last pm) Labetalol 300mg  po BID (increased to this in last week from 200mg  BID) Will follow closely and if HA becomes persistent will consider magnesium, BP only other severe feature at this point.

## 2016-11-28 NOTE — Progress Notes (Signed)
Patient ID: Michelle Holder, female   DOB: 10/12/1981, 36 y.o.   MRN: [id number] Pt had episode of N/V she thinks was related to pain medication from this AM.   HA now minimal  BP 150's/90's most of day  24 hour urine done 11/23/16 with 492 mg protein, so not a drastic change  D/w pt I feel her worsening  HA may be actually from the procardia.  Going to try to d/c and instead change to labetalol 300mg  po TID.   Continue to follow closely

## 2016-11-28 NOTE — Progress Notes (Signed)
Patient ID: Michelle Holder, female   DOB: 11/17/1981, 36 y.o.   MRN: [id number] Pt states HA currently much improved  BP 147/94-154/95  Labs WNL FHR Category 1  Trying to get 24 hour result from office done 11/23/16 s/p betamethasone x 2  11/21/16 and 11/22/16

## 2016-11-29 NOTE — Progress Notes (Signed)
HD #3, [redacted]W[redacted]D, CHTN, ? Superimposed preeclampsia Feeling better, headache improved with stopping Procardia Afeb, VSS, BP labile, 130-170/90-120 FHT- 140-150, mod variability, 10x10 accels, no significant decels Will continue on Labetalol 300 mg tid today, monitor BP and see if we have to do anything else.  Will allow ambulation.

## 2016-11-29 NOTE — Progress Notes (Signed)
Patient placed on EFM for daily monitoring. Minimal variability noted. Patient also had variable decel with return to baseline of 155. Patient placed on left side. Will continue to monitor. Carmelina DaneERRI L Zayon Trulson, RN

## 2016-11-29 NOTE — Progress Notes (Signed)
BP 150/90 FHT this am with mostly mod variability, periods of min variability and had a couple of 2-3 minute decels, just back on monitor for pm tracing Will continue Labetalol and close monitoring

## 2016-11-30 LAB — CULTURE, BETA STREP (GROUP B ONLY)

## 2016-11-30 LAB — TYPE AND SCREEN
ABO/RH(D): AB POS
ANTIBODY SCREEN: NEGATIVE

## 2016-11-30 NOTE — Progress Notes (Addendum)
Patient ID: Michelle PlumeLatoya M Castro Gallimore, female   DOB: 11/05/1981, 36 y.o.   MRN: [id number] Pt frustrated about length of hospitalization. She complains about her BP being checked only either just before or just after meds ( labetalol). Had plans to go on a youth trip to [place] with daughter today ; daughter will be disappointed.  She denies any further headaches since procardia stopped. No blurry vision or scotomata +Fms. No contractions  VS - 156/106 ( states was taken just before given meds)           150-161/84-93 otherwise  EFM - cat 1 TOCO - no contractions SVE - deferred  A/P: HD#4, [redacted]W[redacted]D, CHTN, ? Superimposed preE         Continue on labetalol 300mg  tid strictly         Check BP q 4 hours         Discuss ongoing mgmt with MFM         Reiterated risks with discharge to home with uncontrolled BP; pt agrees

## 2016-11-30 NOTE — Progress Notes (Signed)
Patient just got back from bathroom at this time. Will continue to monitor. Milon DikesKristina D Madelyne Millikan, RN

## 2016-11-30 NOTE — Progress Notes (Addendum)
Patient ID: Michelle Holder, female   DOB: 10/11/1981, 36 y.o.   MRN: [id number] Pt doing well. No headaches or blurry vision. No new complaints VS 145-163/76-101 EFM - cat 1 TOCO - no contractions SVE - deferred  A/P: HD#4, [redacted]W[redacted]D with chtn and superimposed preE         Will continue with pm dose of 300mg  labetalol         Consider increasing to 400mg  if inconsistent results persist; order changed from tid to q 8         S/P course fo BMZ at 31 weeks         Discussed pt care with Dr Katherina Rightenny MFM and he recommends delivery at 34 weeks for preE.         Discussed with pt - all questions answered

## 2016-12-01 DIAGNOSIS — O34211 Maternal care for low transverse scar from previous cesarean delivery: Secondary | ICD-10-CM | POA: Diagnosis present

## 2016-12-01 DIAGNOSIS — O99844 Bariatric surgery status complicating childbirth: Secondary | ICD-10-CM | POA: Diagnosis present

## 2016-12-01 DIAGNOSIS — O4593 Premature separation of placenta, unspecified, third trimester: Secondary | ICD-10-CM | POA: Diagnosis present

## 2016-12-01 DIAGNOSIS — O1002 Pre-existing essential hypertension complicating childbirth: Secondary | ICD-10-CM | POA: Diagnosis present

## 2016-12-01 DIAGNOSIS — R030 Elevated blood-pressure reading, without diagnosis of hypertension: Secondary | ICD-10-CM | POA: Diagnosis present

## 2016-12-01 DIAGNOSIS — O114 Pre-existing hypertension with pre-eclampsia, complicating childbirth: Secondary | ICD-10-CM | POA: Diagnosis present

## 2016-12-01 DIAGNOSIS — O368130 Decreased fetal movements, third trimester, not applicable or unspecified: Secondary | ICD-10-CM | POA: Diagnosis present

## 2016-12-01 DIAGNOSIS — Z3A31 31 weeks gestation of pregnancy: Secondary | ICD-10-CM | POA: Diagnosis not present

## 2016-12-01 MED ORDER — LABETALOL HCL 200 MG PO TABS
400.0000 mg | ORAL_TABLET | Freq: Three times a day (TID) | ORAL | Status: DC
  Administered 2016-12-01 – 2016-12-03 (×6): 400 mg via ORAL
  Filled 2016-12-01 (×6): qty 2

## 2016-12-01 NOTE — Progress Notes (Addendum)
Patient ID: Michelle Holder, female   DOB: 01/12/1981, 36 y.o.   MRN: [id number] Pt doing well with no complaints - denies headaches or blurry vision or contractions. Has questions about bp mgmt/induction vs repeat c/s  VS - 157-162/89-98  EFM - cat 1 TOCO - no contractions  SVE - deferred  A/P: HD#5, [redacted]W[redacted]D with chtn and superimposed preE         Will continue increase to 400mg  labetalol tid         S/P course fo BMZ at 31 weeks          All questions answered

## 2016-12-01 NOTE — Progress Notes (Signed)
Patient resting at this time. Will continue to monitor

## 2016-12-02 LAB — GLUCOSE, CAPILLARY: Glucose-Capillary: 81 mg/dL (ref 65–99)

## 2016-12-02 MED ORDER — SODIUM CHLORIDE 0.9% FLUSH
3.0000 mL | Freq: Two times a day (BID) | INTRAVENOUS | Status: DC
  Administered 2016-12-03: 3 mL via INTRAVENOUS

## 2016-12-02 NOTE — Progress Notes (Signed)
Patient ID: Michelle Holder, female   DOB: October 30, 1981, 36 y.o.   MRN: [id number] Pt and her mother called for a conference to address some questions and concerns with care  Met with charge nurse, house coverage, nurse on duty today and myself and had a productive meeting   All concerns ( mostly regarding timing of meds and bp checks) have been duly noted and will be addressed

## 2016-12-02 NOTE — Progress Notes (Addendum)
Patient ID: Michelle Holder, female   DOB: 05/18/1981, 36 y.o.   MRN: [id number] Pt doing well. No new complaints. Still denies any headaches or blurry vision. +Fms, no contractions. Still hopes for TOLAC  VS- 143-154/91-93  EFM - cat 1 TOCO - no contractions SVE deferred  A/P: HD#5, [redacted]W[redacted]D with chtn and superimposed preE BP better controlled on 400mg  labetalol tid S/P course fo BMZ at 31 weeks  If BP stable later today will likely d/c to home          Pt aware of plan at this time for delivery at 34wks

## 2016-12-02 NOTE — Progress Notes (Addendum)
Patient ID: Michelle Holder, female   DOB: 02/25/1981, 36 y.o.   MRN: [id number]  HD#5 [redacted]W[redacted]D  Pts BP range 160-168/96-102  Does not appear to be well controlled She denies any sx: no headaches, blurry vision or pain  CAT 1 No contractions  Discussed pt ongoing care with Dr Ezzard StandingNewman (MFM):   Plan: No change to meds at this time ( labetalol 400 TID)       If BP worsens, implies worsening preE which would             be indication for delivery ( 11/23/16: US AGA 28%)        If able to keep BP controlled, will deliver at 34weeks        S/P BMZ at 31 weeks        No indication for MgSO4 for CP/neuro prophylaxis                >32weeks   Pt advised to plan of care and all questions answered Risks/benefits  of TOLAC vs repeat c/s also repeated; I recommended repeat c/s

## 2016-12-03 ENCOUNTER — Encounter (HOSPITAL_COMMUNITY)
Admission: AD | Disposition: A | Discharge status: Home / Self Care | Payer: Self-pay | Source: Ambulatory Visit | Attending: Obstetrics and Gynecology

## 2016-12-03 ENCOUNTER — Inpatient Hospital Stay (HOSPITAL_COMMUNITY): Payer: Medicaid Other

## 2016-12-03 ENCOUNTER — Inpatient Hospital Stay (HOSPITAL_COMMUNITY): Payer: Medicaid Other | Admitting: Anesthesiology

## 2016-12-03 ENCOUNTER — Encounter (HOSPITAL_COMMUNITY): Payer: Self-pay

## 2016-12-03 DIAGNOSIS — Z98891 History of uterine scar from previous surgery: Secondary | ICD-10-CM

## 2016-12-03 HISTORY — DX: History of uterine scar from previous surgery: Z98.891

## 2016-12-03 LAB — COMPREHENSIVE METABOLIC PANEL
ALBUMIN: 2.9 g/dL — AB (ref 3.5–5.0)
ALT: 16 U/L (ref 14–54)
ANION GAP: 5 (ref 5–15)
AST: 24 U/L (ref 15–41)
Alkaline Phosphatase: 88 U/L (ref 38–126)
BUN: 12 mg/dL (ref 6–20)
CO2: 23 mmol/L (ref 22–32)
Calcium: 8.6 mg/dL — ABNORMAL LOW (ref 8.9–10.3)
Chloride: 107 mmol/L (ref 101–111)
Creatinine, Ser: 0.99 mg/dL (ref 0.44–1.00)
GFR calc non Af Amer: >60 mL/min (ref >60)
Glucose, Bld: 94 mg/dL (ref 65–99)
POTASSIUM: 4 mmol/L (ref 3.5–5.1)
SODIUM: 135 mmol/L (ref 135–145)
TOTAL PROTEIN: 6.2 g/dL — AB (ref 6.5–8.1)
Total Bilirubin: 0.7 mg/dL (ref 0.3–1.2)

## 2016-12-03 LAB — CBC
HCT: 35.6 % — ABNORMAL LOW (ref 36.0–46.0)
Hemoglobin: 12.2 g/dL (ref 12.0–15.0)
MCH: 31.5 pg (ref 26.0–34.0)
MCHC: 34.3 g/dL (ref 30.0–36.0)
MCV: 92 fL (ref 78.0–100.0)
PLATELETS: 243 10*3/uL (ref 150–400)
RBC: 3.87 MIL/uL (ref 3.87–5.11)
RDW: 13 % (ref 11.5–15.5)
WBC: 8.7 10*3/uL (ref 4.0–10.5)

## 2016-12-03 LAB — TYPE AND SCREEN
ABO/RH(D): AB POS
ANTIBODY SCREEN: NEGATIVE

## 2016-12-03 SURGERY — Surgical Case
Anesthesia: Spinal

## 2016-12-03 MED ORDER — DEXTROSE 5 % IV SOLN
INTRAVENOUS | Status: AC
  Filled 2016-12-03: qty 3000

## 2016-12-03 MED ORDER — LABETALOL HCL 5 MG/ML IV SOLN
20.0000 mg | Freq: Once | INTRAVENOUS | Status: AC
  Administered 2016-12-03: 20 mg via INTRAVENOUS

## 2016-12-03 MED ORDER — NALOXONE HCL 2 MG/2ML IJ SOSY
1.0000 ug/kg/h | PREFILLED_SYRINGE | INTRAVENOUS | Status: DC | PRN
  Filled 2016-12-03: qty 2

## 2016-12-03 MED ORDER — MORPHINE SULFATE (PF) 0.5 MG/ML IJ SOLN
INTRAMUSCULAR | Status: AC
  Filled 2016-12-03: qty 10

## 2016-12-03 MED ORDER — METOCLOPRAMIDE HCL 5 MG/ML IJ SOLN: 10.0000 mg | Freq: Once | INTRAMUSCULAR | Status: DC | PRN

## 2016-12-03 MED ORDER — FENTANYL CITRATE (PF) 100 MCG/2ML IJ SOLN
INTRAMUSCULAR | Status: AC
  Administered 2016-12-03: 50 ug via INTRAVENOUS
  Filled 2016-12-03: qty 2

## 2016-12-03 MED ORDER — HYDRALAZINE HCL 20 MG/ML IJ SOLN
5.0000 mg | INTRAMUSCULAR | Status: AC | PRN
  Administered 2016-12-03 (×2): 10 mg via INTRAVENOUS

## 2016-12-03 MED ORDER — SOD CITRATE-CITRIC ACID 500-334 MG/5ML PO SOLN
30.0000 mL | Freq: Once | ORAL | Status: AC
  Administered 2016-12-03: 30 mL via ORAL

## 2016-12-03 MED ORDER — OXYCODONE HCL 5 MG PO TABS
10.0000 mg | ORAL_TABLET | ORAL | Status: DC | PRN
  Administered 2016-12-05 – 2016-12-06 (×2): 10 mg via ORAL
  Filled 2016-12-03 (×3): qty 2

## 2016-12-03 MED ORDER — PHENYLEPHRINE 8 MG IN D5W 100 ML (0.08MG/ML) PREMIX OPTIME
INJECTION | INTRAVENOUS | Status: DC | PRN
  Administered 2016-12-03: 26.6 ug/min via INTRAVENOUS

## 2016-12-03 MED ORDER — LABETALOL HCL 200 MG PO TABS
600.0000 mg | ORAL_TABLET | Freq: Three times a day (TID) | ORAL | Status: DC
  Filled 2016-12-03: qty 3

## 2016-12-03 MED ORDER — PRENATAL MULTIVITAMIN CH
1.0000 | ORAL_TABLET | Freq: Every day | ORAL | Status: DC
  Administered 2016-12-05 – 2016-12-06 (×2): 1 via ORAL
  Filled 2016-12-03 (×3): qty 1

## 2016-12-03 MED ORDER — LABETALOL HCL 5 MG/ML IV SOLN
20.0000 mg | INTRAVENOUS | Status: DC | PRN
  Administered 2016-12-03: 40 mg via INTRAVENOUS
  Filled 2016-12-03: qty 4

## 2016-12-03 MED ORDER — ONDANSETRON HCL 4 MG/2ML IJ SOLN: 4.0000 mg | Freq: Three times a day (TID) | INTRAMUSCULAR | Status: DC | PRN

## 2016-12-03 MED ORDER — SCOPOLAMINE 1 MG/3DAYS TD PT72
1.0000 | MEDICATED_PATCH | Freq: Once | TRANSDERMAL | Status: DC
  Administered 2016-12-04: 1.5 mg via TRANSDERMAL
  Filled 2016-12-03 (×2): qty 1

## 2016-12-03 MED ORDER — COCONUT OIL OIL: 1.0000 "application " | TOPICAL_OIL | Status: DC | PRN

## 2016-12-03 MED ORDER — BUPIVACAINE IN DEXTROSE 0.75-8.25 % IT SOLN
INTRATHECAL | Status: DC | PRN
  Administered 2016-12-03: 10.5 mL via INTRATHECAL

## 2016-12-03 MED ORDER — SODIUM CHLORIDE 0.9 % IR SOLN
Status: DC | PRN
  Administered 2016-12-03: 1000 mL

## 2016-12-03 MED ORDER — KETOROLAC TROMETHAMINE 30 MG/ML IJ SOLN
30.0000 mg | Freq: Four times a day (QID) | INTRAMUSCULAR | Status: AC | PRN
  Filled 2016-12-03: qty 1

## 2016-12-03 MED ORDER — LACTATED RINGERS IV SOLN
INTRAVENOUS | Status: DC
  Administered 2016-12-03 – 2016-12-04 (×2): via INTRAVENOUS

## 2016-12-03 MED ORDER — LACTATED RINGERS IV SOLN
INTRAVENOUS | Status: DC | PRN
  Administered 2016-12-03: 19:00:00 via INTRAVENOUS

## 2016-12-03 MED ORDER — DIPHENHYDRAMINE HCL 25 MG PO CAPS
25.0000 mg | ORAL_CAPSULE | ORAL | Status: DC | PRN
  Administered 2016-12-04 – 2016-12-05 (×4): 25 mg via ORAL
  Filled 2016-12-03 (×4): qty 1

## 2016-12-03 MED ORDER — DEXTROSE 5 % IV SOLN
INTRAVENOUS | Status: DC | PRN
  Administered 2016-12-03: 3 g via INTRAVENOUS

## 2016-12-03 MED ORDER — NALBUPHINE HCL 10 MG/ML IJ SOLN: 5.0000 mg | Freq: Once | INTRAMUSCULAR | Status: DC | PRN

## 2016-12-03 MED ORDER — LABETALOL HCL 200 MG PO TABS
600.0000 mg | ORAL_TABLET | Freq: Three times a day (TID) | ORAL | Status: DC
  Administered 2016-12-03: 600 mg via ORAL
  Filled 2016-12-03: qty 3

## 2016-12-03 MED ORDER — SIMETHICONE 80 MG PO CHEW
80.0000 mg | CHEWABLE_TABLET | Freq: Three times a day (TID) | ORAL | Status: DC
  Administered 2016-12-04 – 2016-12-06 (×8): 80 mg via ORAL
  Filled 2016-12-03 (×7): qty 1

## 2016-12-03 MED ORDER — TETANUS-DIPHTH-ACELL PERTUSSIS 5-2.5-18.5 LF-MCG/0.5 IM SUSP: 0.5000 mL | Freq: Once | INTRAMUSCULAR | Status: DC

## 2016-12-03 MED ORDER — NALBUPHINE HCL 10 MG/ML IJ SOLN: 5.0000 mg | INTRAMUSCULAR | Status: DC | PRN

## 2016-12-03 MED ORDER — SIMETHICONE 80 MG PO CHEW: 80.0000 mg | CHEWABLE_TABLET | ORAL | Status: DC | PRN

## 2016-12-03 MED ORDER — LABETALOL HCL 5 MG/ML IV SOLN
INTRAVENOUS | Status: AC
  Filled 2016-12-03: qty 8

## 2016-12-03 MED ORDER — ONDANSETRON HCL 4 MG/2ML IJ SOLN
INTRAMUSCULAR | Status: DC | PRN
  Administered 2016-12-03: 4 mg via INTRAVENOUS

## 2016-12-03 MED ORDER — ZOLPIDEM TARTRATE 5 MG PO TABS
5.0000 mg | ORAL_TABLET | Freq: Every evening | ORAL | Status: DC | PRN
Start: 2016-12-03 — End: 2016-12-06

## 2016-12-03 MED ORDER — ACETAMINOPHEN 325 MG PO TABS: 650.0000 mg | ORAL_TABLET | ORAL | Status: DC | PRN

## 2016-12-03 MED ORDER — OXYTOCIN 40 UNITS IN LACTATED RINGERS INFUSION - SIMPLE MED: 2.5000 [IU]/h | INTRAVENOUS | Status: AC

## 2016-12-03 MED ORDER — NALOXONE HCL 0.4 MG/ML IJ SOLN: 0.4000 mg | INTRAMUSCULAR | Status: DC | PRN

## 2016-12-03 MED ORDER — MAGNESIUM SULFATE BOLUS VIA INFUSION
4.0000 g | Freq: Once | INTRAVENOUS | Status: AC
  Administered 2016-12-03: 4 g via INTRAVENOUS
  Filled 2016-12-03: qty 500

## 2016-12-03 MED ORDER — LACTATED RINGERS IV SOLN
INTRAVENOUS | Status: DC | PRN
  Administered 2016-12-03: 18:00:00 via INTRAVENOUS

## 2016-12-03 MED ORDER — LACTATED RINGERS IV SOLN: INTRAVENOUS | Status: DC

## 2016-12-03 MED ORDER — FENTANYL CITRATE (PF) 100 MCG/2ML IJ SOLN
25.0000 ug | INTRAMUSCULAR | Status: DC | PRN
  Administered 2016-12-03 (×2): 50 ug via INTRAVENOUS

## 2016-12-03 MED ORDER — LABETALOL HCL 5 MG/ML IV SOLN
INTRAVENOUS | Status: AC
  Filled 2016-12-03: qty 4

## 2016-12-03 MED ORDER — FENTANYL CITRATE (PF) 100 MCG/2ML IJ SOLN
INTRAMUSCULAR | Status: AC
  Filled 2016-12-03: qty 2

## 2016-12-03 MED ORDER — SOD CITRATE-CITRIC ACID 500-334 MG/5ML PO SOLN
ORAL | Status: AC
  Filled 2016-12-03: qty 15

## 2016-12-03 MED ORDER — ONDANSETRON HCL 4 MG/2ML IJ SOLN
INTRAMUSCULAR | Status: AC
  Filled 2016-12-03: qty 2

## 2016-12-03 MED ORDER — MORPHINE SULFATE (PF) 0.5 MG/ML IJ SOLN
INTRAMUSCULAR | Status: DC | PRN
  Administered 2016-12-03: .2 mg via INTRATHECAL

## 2016-12-03 MED ORDER — LACTATED RINGERS IV SOLN
INTRAVENOUS | Status: DC | PRN
  Administered 2016-12-03: 40 [IU] via INTRAVENOUS

## 2016-12-03 MED ORDER — KETOROLAC TROMETHAMINE 30 MG/ML IJ SOLN: 30.0000 mg | Freq: Four times a day (QID) | INTRAMUSCULAR | Status: AC | PRN

## 2016-12-03 MED ORDER — FENTANYL CITRATE (PF) 100 MCG/2ML IJ SOLN
INTRAMUSCULAR | Status: DC | PRN
  Administered 2016-12-03: 12.5 ug via INTRATHECAL

## 2016-12-03 MED ORDER — LABETALOL HCL 5 MG/ML IV SOLN
20.0000 mg | Freq: Once | INTRAVENOUS | Status: AC
Start: 2016-12-03 — End: 2016-12-03

## 2016-12-03 MED ORDER — SODIUM CHLORIDE 0.9% FLUSH: 3.0000 mL | INTRAVENOUS | Status: DC | PRN

## 2016-12-03 MED ORDER — IBUPROFEN 800 MG PO TABS
800.0000 mg | ORAL_TABLET | Freq: Three times a day (TID) | ORAL | Status: DC
  Administered 2016-12-04 – 2016-12-06 (×7): 800 mg via ORAL
  Filled 2016-12-03 (×8): qty 1

## 2016-12-03 MED ORDER — LABETALOL HCL 200 MG PO TABS
400.0000 mg | ORAL_TABLET | Freq: Three times a day (TID) | ORAL | Status: DC
  Administered 2016-12-03: 400 mg via ORAL

## 2016-12-03 MED ORDER — MEPERIDINE HCL 25 MG/ML IJ SOLN: 6.2500 mg | INTRAMUSCULAR | Status: DC | PRN

## 2016-12-03 MED ORDER — SIMETHICONE 80 MG PO CHEW
80.0000 mg | CHEWABLE_TABLET | ORAL | Status: DC
  Administered 2016-12-03 – 2016-12-05 (×2): 80 mg via ORAL
  Filled 2016-12-03 (×3): qty 1

## 2016-12-03 MED ORDER — DIPHENHYDRAMINE HCL 25 MG PO CAPS
25.0000 mg | ORAL_CAPSULE | Freq: Four times a day (QID) | ORAL | Status: DC | PRN
Start: 2016-12-03 — End: 2016-12-06

## 2016-12-03 MED ORDER — MENTHOL 3 MG MT LOZG: 1.0000 | LOZENGE | OROMUCOSAL | Status: DC | PRN

## 2016-12-03 MED ORDER — OXYTOCIN 10 UNIT/ML IJ SOLN
INTRAMUSCULAR | Status: AC
  Filled 2016-12-03: qty 4

## 2016-12-03 MED ORDER — OXYCODONE HCL 5 MG PO TABS
5.0000 mg | ORAL_TABLET | ORAL | Status: DC | PRN
  Administered 2016-12-04 – 2016-12-05 (×5): 5 mg via ORAL
  Filled 2016-12-03 (×5): qty 1

## 2016-12-03 MED ORDER — PHENYLEPHRINE 8 MG IN D5W 100 ML (0.08MG/ML) PREMIX OPTIME
INJECTION | INTRAVENOUS | Status: AC
  Filled 2016-12-03: qty 100

## 2016-12-03 MED ORDER — DIPHENHYDRAMINE HCL 50 MG/ML IJ SOLN
12.5000 mg | INTRAMUSCULAR | Status: DC | PRN
  Administered 2016-12-04: 12.5 mg via INTRAVENOUS
  Filled 2016-12-03: qty 1

## 2016-12-03 MED ORDER — SENNOSIDES-DOCUSATE SODIUM 8.6-50 MG PO TABS
2.0000 | ORAL_TABLET | ORAL | Status: DC
  Administered 2016-12-03 – 2016-12-06 (×3): 2 via ORAL
  Filled 2016-12-03 (×3): qty 2

## 2016-12-03 MED ORDER — HYDRALAZINE HCL 20 MG/ML IJ SOLN
INTRAMUSCULAR | Status: AC
  Filled 2016-12-03: qty 1

## 2016-12-03 MED ORDER — MAGNESIUM SULFATE 50 % IJ SOLN
2.0000 g/h | INTRAVENOUS | Status: AC
  Administered 2016-12-03 – 2016-12-04 (×2): 2 g/h via INTRAVENOUS
  Filled 2016-12-03 (×2): qty 80

## 2016-12-03 SURGICAL SUPPLY — 38 items
APL SKNCLS STERI-STRIP NONHPOA (GAUZE/BANDAGES/DRESSINGS) ×1
BENZOIN TINCTURE PRP APPL 2/3 (GAUZE/BANDAGES/DRESSINGS) ×3 IMPLANT
CHLORAPREP W/TINT 26ML (MISCELLANEOUS) ×3 IMPLANT
CLAMP CORD UMBIL (MISCELLANEOUS) IMPLANT
CLOSURE WOUND 1/2 X4 (GAUZE/BANDAGES/DRESSINGS) ×1
CLOTH BEACON ORANGE TIMEOUT ST (SAFETY) ×3 IMPLANT
CONTAINER PREFILL 10% NBF 15ML (MISCELLANEOUS) IMPLANT
DRSG OPSITE POSTOP 4X10 (GAUZE/BANDAGES/DRESSINGS) ×3 IMPLANT
ELECT REM PT RETURN 9FT ADLT (ELECTROSURGICAL) ×3
ELECTRODE REM PT RTRN 9FT ADLT (ELECTROSURGICAL) ×1 IMPLANT
EXTRACTOR VACUUM M CUP 4 TUBE (SUCTIONS) IMPLANT
EXTRACTOR VACUUM M CUP 4' TUBE (SUCTIONS)
GLOVE BIO SURGEON STRL SZ 6.5 (GLOVE) ×2 IMPLANT
GLOVE BIO SURGEONS STRL SZ 6.5 (GLOVE) ×1
GLOVE BIOGEL PI IND STRL 7.0 (GLOVE) ×1 IMPLANT
GLOVE BIOGEL PI INDICATOR 7.0 (GLOVE) ×2
GOWN STRL REUS W/TWL LRG LVL3 (GOWN DISPOSABLE) ×6 IMPLANT
KIT ABG SYR 3ML LUER SLIP (SYRINGE) IMPLANT
NDL HYPO 25X5/8 SAFETYGLIDE (NEEDLE) IMPLANT
NEEDLE HYPO 25X5/8 SAFETYGLIDE (NEEDLE) IMPLANT
NS IRRIG 1000ML POUR BTL (IV SOLUTION) ×3 IMPLANT
PACK C SECTION WH (CUSTOM PROCEDURE TRAY) ×3 IMPLANT
PAD OB MATERNITY 4.3X12.25 (PERSONAL CARE ITEMS) ×3 IMPLANT
PENCIL SMOKE EVAC W/HOLSTER (ELECTROSURGICAL) ×3 IMPLANT
RTRCTR C-SECT PINK 25CM LRG (MISCELLANEOUS) ×3 IMPLANT
STRIP CLOSURE SKIN 1/2X4 (GAUZE/BANDAGES/DRESSINGS) ×2 IMPLANT
SUT MNCRL 0 VIOLET CTX 36 (SUTURE) ×2 IMPLANT
SUT MONOCRYL 0 CTX 36 (SUTURE) ×4
SUT PLAIN 1 NONE 54 (SUTURE) IMPLANT
SUT PLAIN 2 0 XLH (SUTURE) ×3 IMPLANT
SUT VIC AB 0 CT1 27 (SUTURE) ×6
SUT VIC AB 0 CT1 27XBRD ANBCTR (SUTURE) ×2 IMPLANT
SUT VIC AB 2-0 CT1 27 (SUTURE) ×3
SUT VIC AB 2-0 CT1 TAPERPNT 27 (SUTURE) ×1 IMPLANT
SUT VIC AB 4-0 KS 27 (SUTURE) ×3 IMPLANT
SYR BULB IRRIGATION 50ML (SYRINGE) ×3 IMPLANT
TOWEL OR 17X24 6PK STRL BLUE (TOWEL DISPOSABLE) ×3 IMPLANT
TRAY FOLEY CATH SILVER 14FR (SET/KITS/TRAYS/PACK) ×3 IMPLANT

## 2016-12-03 NOTE — Anesthesia Preprocedure Evaluation (Signed)
Anesthesia Evaluation  Patient identified by MRN, date of birth, ID band Patient awake    Reviewed: Allergy & Precautions, NPO status , Patient's Chart, lab work & pertinent test results  Airway Mallampati: II  TM Distance: >3 FB Neck ROM: Full    Dental no notable dental hx.    Pulmonary neg pulmonary ROS,    Pulmonary exam normal breath sounds clear to auscultation       Cardiovascular hypertension, Pt. on medications Normal cardiovascular exam Rhythm:Regular Rate:Normal     Neuro/Psych negative neurological ROS  negative psych ROS   GI/Hepatic negative GI ROS, Neg liver ROS,   Endo/Other  negative endocrine ROS  Renal/GU negative Renal ROS  negative genitourinary   Musculoskeletal negative musculoskeletal ROS (+)   Abdominal   Peds negative pediatric ROS (+)  Hematology negative hematology ROS (+)   Anesthesia Other Findings   Reproductive/Obstetrics (+) Pregnancy Abruption. Hemodynamically stable for now                              Anesthesia Physical Anesthesia Plan  ASA: II and emergent  Anesthesia Plan: Spinal   Post-op Pain Management:    Induction:   Airway Management Planned: Natural Airway  Additional Equipment:   Intra-op Plan:   Post-operative Plan:   Informed Consent: I have reviewed the patients History and Physical, chart, labs and discussed the procedure including the risks, benefits and alternatives for the proposed anesthesia with the patient or authorized representative who has indicated his/her understanding and acceptance.   Dental advisory given  Plan Discussed with: CRNA  Anesthesia Plan Comments:         Anesthesia Quick Evaluation

## 2016-12-03 NOTE — Progress Notes (Signed)
[facility]  Delivery Note:  C-section       12/03/2016  6:08 PM  I was called to the operating room at the request of the patient's obstetrician (Dr. Bouvard-Stuckert) for a repeat c-section at 32 weeks due to a non reassuring BPP in the setting of pre-eclampsia.  PRENATAL HX:  This is a 35 y/o G5P3104 at 32 and 1/[redacted] weeks gestation who was admitted on 11/27/16 for poorly controlled hypertension, due to chronic hypertension with superimposed preeclampsia.   Her hypertension is worsening despite therapy with labetalol.  She received BMZ on 1/3 and 1/4.  A BPP was non-reassuring this afternoon so infant was delivered by repeat c-section.    INTRAPARTUM HX:   Repeat c-section with AROM at delivery  DELIVERY:  Cord clamping delayed for 60 seconds.  Infant was vigorous at delivery, initially requiring no resuscitation other than standard warming, drying and stimulation.  APGARs 8 and 8.  O2 saturations were only up to 70s by 5 minutes, so blow by O2 60applied on and off from 5-10 minutes, O2 saturation in the mid 80s on admission.  Exam within normal limits.  Will admit to NICU for prematurity.     _____________________ Electronically Signed By: Ponciano Shealy, MD Neonatologist  

## 2016-12-03 NOTE — Progress Notes (Signed)
Decision made for cesarean section due to ultrasound finding. Patient prepped and taken to OR. Carmelina DaneERRI L Amarri Michaelson, RN

## 2016-12-03 NOTE — Brief Op Note (Signed)
11/27/2016 - 12/03/2016  7:51 PM  PATIENT:  Michelle Holder  36 y.o. female  PRE-OPERATIVE DIAGNOSIS:  Repeat C/S for decreased BPP, possible abruption  POST-OPERATIVE DIAGNOSIS:  Repeat C/S for decreased BPP, possible abruption  PROCEDURE:  Procedure(s): CESAREAN SECTION (N/A)  SURGEON:  Surgeon(s) and Role:    * Sherian ReinJody Bovard-Stuckert, MD - Primary  ASSISTANTS: Mamie Leversucker, Tracy RNFA   ANESTHESIA:   spinal  EBL:  Total I/O In: 1400 [I.V.:1400] Out: - uop 150cc, clear EBL 500cc   FINDINGS: viable female infant at 18:41, apgars 8/8 at 1 and 5 min, wt 3#3.1 oz, nl PP uterus, tubes and ovaries.  BLOOD ADMINISTERED:none  DRAINS: Urinary Catheter (Foley)   LOCAL MEDICATIONS USED:  NONE  SPECIMEN:  Source of Specimen:  Placenta   DISPOSITION OF SPECIMEN:  PATHOLOGY  COUNTS:  YES  TOURNIQUET:  * No tourniquets in log *  DICTATION: .Other Dictation: Dictation Number [id number]  PLAN OF CARE: not applicable  PATIENT DISPOSITION:  PACU - hemodynamically stable.   Delay start of Pharmacological VTE agent (>24hrs) due to surgical blood loss or risk of bleeding: not applicable

## 2016-12-03 NOTE — Anesthesia Procedure Notes (Signed)
Spinal  Patient location during procedure: OR Staffing Anesthesiologist: Areil Ottey Performed: anesthesiologist  Preanesthetic Checklist Completed: patient identified, site marked, surgical consent, pre-op evaluation, timeout performed, IV checked, risks and benefits discussed and monitors and equipment checked Spinal Block Patient position: sitting Prep: ChloraPrep Patient monitoring: heart rate, continuous pulse ox and blood pressure Approach: midline Location: L4-5 Injection technique: single-shot Needle Needle type: Sprotte  Needle gauge: 24 G Needle length: 9 cm Additional Notes Expiration date of kit checked and confirmed. Patient tolerated procedure well, without complications.       

## 2016-12-03 NOTE — Anesthesia Postprocedure Evaluation (Signed)
Anesthesia Post Note  Patient: Michelle Holder  Procedure(s) Performed: Procedure(s) (LRB): CESAREAN SECTION (N/A)  Patient location during evaluation: PACU Anesthesia Type: Spinal Level of consciousness: awake and alert Pain management: pain level controlled Vital Signs Assessment: post-procedure vital signs reviewed and stable Respiratory status: spontaneous breathing, nonlabored ventilation, respiratory function stable and patient connected to nasal cannula oxygen Cardiovascular status: blood pressure returned to baseline and stable Postop Assessment: no signs of nausea or vomiting and spinal receding Anesthetic complications: no        Last Vitals:  Vitals:   12/03/16 2115 12/03/16 2117  BP: (!) 166/101 (!) 166/101  Pulse: 75   Resp: 16   Temp: 36.4 C     Last Pain:  Vitals:   12/03/16 2115  TempSrc: Oral  PainSc: 5    Pain Goal: Patients Stated Pain Goal: 2 (12/02/16 2016)               Phillips Groutarignan, Monia Timmers

## 2016-12-03 NOTE — Transfer of Care (Signed)
Immediate Anesthesia Transfer of Care Note  Patient: Michelle PlumeLatoya M Castro Gallimore  Procedure(s) Performed: Procedure(s): CESAREAN SECTION (N/A)  Patient Location: PACU  Anesthesia Type:Spinal  Level of Consciousness: awake, alert  and oriented  Airway & Oxygen Therapy: Patient Spontanous Breathing  Post-op Assessment: Report given to RN and Post -op Vital signs reviewed and stable  Post vital signs: Reviewed and stable  Last Vitals:  Vitals:   12/03/16 1456 12/03/16 1730  BP: (!) 141/71 (!) 175/95  Pulse: 76 68  Resp: 18   Temp: 36.7 C     Last Pain:  Vitals:   12/03/16 1500  TempSrc:   PainSc: 0-No pain      Patients Stated Pain Goal: 2 (12/02/16 2016)  Complications: No apparent anesthesia complications

## 2016-12-03 NOTE — Progress Notes (Signed)
Patient ID: Michelle PlumeLatoya M Castro Gallimore, female   DOB: 04/26/1981, 36 y.o.   MRN: [id number]   BP range 153-163/87-96 noted

## 2016-12-03 NOTE — Progress Notes (Addendum)
Patient ID: Michelle Holder, female   DOB: 12/25/1980, 36 y.o.   MRN: [id number]   Appreciate MFM consult BPP 8/8, 40% growth on US.  Nl AFI  D/W MFM d/c NST now, as BPP = 8/8  Repeat NST this 4PM, if decreased variability - will get BPP Also increase labetalol to 600mg  tid.    Close monitoring

## 2016-12-03 NOTE — Progress Notes (Signed)
Patient ID: Michelle Holder, female   DOB: 05/21/1981, 36 y.o.   MRN: [id number]   BPP non reassuring, MFM recommends delivery now.  D/w pt r/b/a of LTCS.  Will proceed ASAP

## 2016-12-03 NOTE — Progress Notes (Signed)
Discussed this case with Dr. Bovard-Stuckert and Dr. Mindi SlickerBangHinton Raoa. Review of this patient's prenatal course and labs make me confident we are dealing with superimposed preeclampsia. However, her baseline BPs and noncompliance with treatment make judging the progression of her BP problematic. All HELLP labs are normal, and the patient is asymptomatic. She has no evidence of neuromuscular hyperexcitability. Her US today is reassuring, with normal fluid and growth. I have suggested that her Labetalol be increased to 600mg  q8h today. As long as she remains asymptomatic and with normal platelets and LFTs, I am comfortable going to the maximum dose of Labetalol (800mg  q8h) to attempt to control her blood pressures. However, should she become symptomatic, develop signs of neuromuscular hyperexcitability, develop abnormal HELLP labs, or fail to be controlled on the maximum dose of Labetalol, I believe she should be delivered.

## 2016-12-03 NOTE — Progress Notes (Signed)
Dr. Hinton RaoBovard-Stuckert contacted in regards to minimal variability on EFM. Dr. Andrey CotaStates to leave patient on monitor and she will be in to assess. Carmelina DaneERRI L Mackson Botz, RN

## 2016-12-03 NOTE — Progress Notes (Signed)
Patient ID: Michelle Holder, female   DOB: 06/10/1981, 36 y.o.   MRN: [id number]   CHT/SIPE/Elevated BP 32+ week  Pt denies HA, Feels well  Long discussion re BP control and need for continued admission Dr Mindi SlickerBanga, Dr Ezzard StandingNewman and I developed plan for delivery as pt has CHTN, likely SIPE and BP is elevated Will add DTRs to vitals.    AF 160's/80-90, good up Nl labs - LFTs controlled, nl Cr  FHTs 140-150, mod var, category 1-2 toco rare  Continue current mgmt Mg if IOL Close monitoring Labetalol tid

## 2016-12-04 ENCOUNTER — Encounter (HOSPITAL_COMMUNITY): Payer: Self-pay | Admitting: *Deleted

## 2016-12-04 LAB — CBC
HEMATOCRIT: 35.9 % — AB (ref 36.0–46.0)
HEMOGLOBIN: 12.4 g/dL (ref 12.0–15.0)
MCH: 31.6 pg (ref 26.0–34.0)
MCHC: 34.5 g/dL (ref 30.0–36.0)
MCV: 91.6 fL (ref 78.0–100.0)
Platelets: 238 10*3/uL (ref 150–400)
RBC: 3.92 MIL/uL (ref 3.87–5.11)
RDW: 13.1 % (ref 11.5–15.5)
WBC: 11.3 10*3/uL — AB (ref 4.0–10.5)

## 2016-12-04 MED ORDER — LACTATED RINGERS IV SOLN: INTRAVENOUS | Status: DC

## 2016-12-04 MED ORDER — WITCH HAZEL-GLYCERIN EX PADS: 1.0000 "application " | MEDICATED_PAD | CUTANEOUS | Status: DC | PRN

## 2016-12-04 MED ORDER — DEXTROSE 5 % IV SOLN
3.0000 g | INTRAVENOUS | Status: DC
  Filled 2016-12-04: qty 3000

## 2016-12-04 MED ORDER — DIBUCAINE 1 % RE OINT
1.0000 | TOPICAL_OINTMENT | RECTAL | Status: DC | PRN
Start: 2016-12-04 — End: 2016-12-06

## 2016-12-04 NOTE — Progress Notes (Addendum)
Patient ID: Michelle Holder, female   DOB: 06/04/1981, 36 y.o.   MRN: [id number] Pt doing well. In good spirits. No headaches, fever or chills. No further lightheadedness. Tolerating po. Pain well controlled. Tol MgSO4 VS 105-134/69-85 ABD- soft, ND, dressing c/d/i EXT - no homans  11.3>12.4<238  A/P: POD #1 s/p emergent c/s for fetal distress, chtn and preecclampsia - BP stable at this time; skipped am labetalol - on MgSO4 pp       Routine pp/post op care

## 2016-12-04 NOTE — Progress Notes (Signed)
Patient was assisted to get up from bed after her 6 hours, per the NT, patient's blood pressure dropped and she became symptomatic with dizziness, tachycardia and hypotensive(57/48). She was quickly assisted back to bed and was monitored closely.

## 2016-12-04 NOTE — Lactation Note (Addendum)
This note was copied from a baby's chart. Lactation Consultation Note  Patient Name: Michelle Holder WUJWJ'XToday's Date: 12/04/2016 Reason for consult: Initial assessment;NICU baby  NICU baby 2817 hours old. Mom reports that she has used DEBP once this morning, 3 hours earlier, and is ready to pump again. Michelle SemenAshton, RN (shadowing), assisted mom with hand expression with no colostrum present. Assisted mom to use DEBP, and assessed for proper flange fit--#24. Discussed importance of pumping every 2-3 hours for a total of 8 times/24 hours followed by hand expression. Mom given all collection supplies with review and states that she has no questions at this time. [facility] and enc to call for assistance as needed. Discussed 2-week pump rental and mom has paperwork. Mom aware of OP/BFSG and LC phone line assistance after D/C.  Maternal Data Has patient been taught Hand Expression?: Yes Does the patient have breastfeeding experience prior to this delivery?: Yes  Feeding    LATCH Score/Interventions                      Lactation Tools Discussed/Used WIC Program: No Pump Review: Milk Storage;Setup, frequency, and cleaning Initiated by:: Bedside RN. Date initiated:: 12/04/16   Consult Status Consult Status: Follow-up Date: 12/05/16 Follow-up type: In-patient    Sherlyn HayJennifer D Camry Theiss 12/04/2016, 12:31 PM

## 2016-12-04 NOTE — Progress Notes (Signed)
Subjective: Postpartum Day 1: Cesarean Delivery Patient reports tolerating PO.  Pain controlled  Objective: Vital signs in last 24 hours: Temp:  [97.4 F (36.3 C)-98.4 F (36.9 C)] 97.6 F (36.4 C) (01/16 0738) Pulse Rate:  [55-86] 69 (01/16 0440) Resp:  [9-20] 18 (01/16 0738) BP: (97-175)/(52-133) 133/85 (01/16 0738) SpO2:  [96 %-100 %] 99 % (01/16 0440)  Physical Exam:  General: alert and cooperative Lochia: appropriate Uterine Fundus: firm Incision: C/D/I    Recent Labs  12/03/16 0529 12/04/16 0550  HGB 12.2 12.4  HCT 35.6* 35.9*    Assessment/Plan: Status post Cesarean section. Doing well postoperatively.  Pt with excellent diuresis and BP was low overnight.  Labetalol still in for 400mg  po TID, may need to back that down further.  BP 130/80's currently.   Will d/c magnesium this PM Labs WNL  Michelle Holder W 12/04/2016, 9:26 AM

## 2016-12-04 NOTE — Addendum Note (Signed)
Addendum  created 12/04/16 [id number] by Shanon PayorSuzanne M Encarnacion Bole, CRNA   Sign clinical note

## 2016-12-04 NOTE — Anesthesia Postprocedure Evaluation (Signed)
Anesthesia Post Note  Patient: Michelle Holder  Procedure(s) Performed: Procedure(s) (LRB): CESAREAN SECTION (N/A)  Patient location during evaluation: Mother Baby Anesthesia Type: Spinal Level of consciousness: awake and alert and oriented Pain management: satisfactory to patient Vital Signs Assessment: post-procedure vital signs reviewed and stable Respiratory status: spontaneous breathing and nonlabored ventilation Cardiovascular status: stable Postop Assessment: no headache, no backache, patient able to bend at knees, no signs of nausea or vomiting and adequate PO intake Anesthetic complications: no        Last Vitals:  Vitals:   12/04/16 0440 12/04/16 0738  BP: 134/82 133/85  Pulse: 69   Resp: 18 18  Temp: 36.8 C 36.4 C    Last Pain:  Vitals:   12/04/16 0440  TempSrc: Oral  PainSc:    Pain Goal: Patients Stated Pain Goal: 2 (12/02/16 2016)               Madison HickmanGREGORY,Shakti Fleer

## 2016-12-04 NOTE — Progress Notes (Signed)
Offered support to pt after delivery.  She is in good spirits and is grateful to be on the other side of delivery and very grateful that her baby is doing well.  Her 38 year old was in the NICU as well and she is doing fine now, so [place] feels comfortable with the situation.  She is eager to be able to get back home to be with her other daughters, but she has had good support from family and friends to help with them.    Chaplain Dyanne CarrelKaty Johnhenry Tippin, Bcc Pager, [phone] 3:55 PM

## 2016-12-04 NOTE — Progress Notes (Signed)
Patient ID: Michelle Holder, female   DOB: 10/31/1981, 36 y.o.   MRN: [id number] BP normal for most of day so labetalol d/c'd as was being held  Continued diuresis of 2.4L over day Magnesium d/c'd. Will follow BP to see if need to restart labetalol at lower dose, pt was not on meds prior to pregnancy

## 2016-12-05 NOTE — Progress Notes (Signed)
Dr. Jackelyn KnifeMeisinger notified. Orders received to change honeycomb dressing. MD aware of pt's current Bp. No orders received for BP but to monitor per order.

## 2016-12-05 NOTE — Progress Notes (Signed)
Subjective: Postpartum Day 2: Cesarean Delivery Patient reports incisional pain, tolerating PO and no problems voiding.  Baby stable in NICU.  Objective: Vital signs in last 24 hours: Temp:  [98 F (36.7 C)-98.3 F (36.8 C)] 98.3 F (36.8 C) (01/17 0751) Pulse Rate:  [57-74] 70 (01/17 0811) Resp:  [18] 18 (01/17 0751) BP: (126-163)/(75-98) 142/91 (01/17 0811) SpO2:  [97 %-100 %] 100 % (01/17 0355)  Physical Exam:  General: alert Lochia: appropriate Uterine Fundus: firm Incision: healing well    Recent Labs  12/03/16 0529 12/04/16 0550  HGB 12.2 12.4  HCT 35.6* 35.9*    Assessment/Plan: Status post Cesarean section. Doing well postoperatively. BP is a bit labile off meds for now. Continue current care.  Will monitor BP and restart PO Labetalol if needed.    Michelle Holder D 12/05/2016, 8:27 AM

## 2016-12-05 NOTE — Lactation Note (Signed)
This note was copied from a baby's chart. Lactation Consultation Note  Patient Name: Girl Allene DillonLatoya Castro Gallimore ZOXWR'UToday's Date: 12/05/2016 Reason for consult: Follow-up assessment;NICU baby  NICU baby 8141 hours old. Mom reports that she is not seeing much EBM when she pumps and hand expresses. Assisted mom with hand expression with no colostrum flowing. However, mom has dried colostrum on nipples from where she pumped earlier. Mom reports that her milk always comes in slowly, and she is planning to take Fenugreek. Enc mom to discussed with her provider, and also mentioned steel-cut oatmeal. Discussed primary importance of pumping and hand expression. Mom states that she is pumping at least every 3 hours and is pumping at least a total of 8 times/24 hours. Enc mom not to watch her breasts while pumping, but to have pictures of the baby--possibly pictures of her holding the baby STS. Enc mom not to be discouraged, and discussed progression of milk coming to volume.   Maternal Data    Feeding Feeding Type: Donor Breast Milk  LATCH Score/Interventions                      Lactation Tools Discussed/Used     Consult Status Consult Status: Follow-up Date: 12/06/16 Follow-up type: In-patient    Sherlyn HayJennifer D Tobenna Needs 12/05/2016, 11:48 AM

## 2016-12-05 NOTE — Op Note (Signed)
NAME:  Michelle Holder, Michelle Holder     ACCOUNT NO.:  [id number]  MEDICAL RECORD NO.:  [id number]  LOCATION:                                 FACILITY:  PHYSICIAN:  Sherron Monday, MD        DATE OF BIRTH:  1981-06-12  DATE OF PROCEDURE:  12/03/2016 DATE OF DISCHARGE:                              OPERATIVE REPORT   PREOPERATIVE DIAGNOSIS:  Intrauterine pregnancy at 32 weeks, repeat cesarean section for decreased BPP, possible abruption.  POSTOPERATIVE DIAGNOSIS:  Intrauterine pregnancy at 32 weeks, repeat cesarean section for decreased BPP, possible abruption, delivered.  PROCEDURE:  Repeat low-transverse cesarean section.  SURGEON:  Sherron Monday, MD.  ASSISTANT:  Genice Rouge, RNFA.  ANESTHESIA:  Spinal.  IV FLUIDS:  1400 mL.  URINE OUTPUT:  150 mL clear urine at the end of the procedure.  ESTIMATED BLOOD LOSS:  500 mL.  FINDINGS:  Viable female infant at 6:41 p.m. with Apgars of 8 at 1 minute and 8 at 5 minutes, and weight of 3 pounds 3.1 ounces.  Normal postpartum uterus, tubes, and ovaries are noted.  COMPLICATIONS:  None.  PATHOLOGY:  Placenta.  DESCRIPTION OF PROCEDURE:  After informed consent was reviewed with the patient including risks, benefits, and alternatives of the surgical procedure, she was transported to the OR where spinal anesthesia was placed and found to be adequate.  She was then returned to the supine position with a leftward tilt, prepped and draped in the normal sterile fashion.  A Foley catheter was sterilely placed.  A Pfannenstiel skin incision was then made at the level of her previous incision approximately 2 fingerbreadths above the pubic symphysis, carried through to the underlying layer of fascia sharply.  The fascia was incised in midline and incision was extended laterally with Mayo scissors.  The superior aspect of the fascial incision grasped with Kocher clamps, elevated, and rectus muscles were dissected off both bluntly and sharply.   Midline was easily identified and the peritoneum was entered bluntly.  The incision was extended superiorly and inferiorly with good visualization of the bladder.  Alexis skin retractor was placed carefully making sure that no bowel was entrapped. The uterus was explored and then incised in a transverse fashion. Infant was delivered from a vertex presentation.  With delivery bloody brown, but clear fluid was noted.  After delivery cord was awaited to be clamped for a minute.  The infant was somewhat resuscitated during this time and was crying.  The nose and mouth had been suctioned on the field.  Infant was handed off to the awaiting pediatric staff.  The placenta was expressed from the uterus.  Uterus was cleared of all clot and debris.  The uterine incision was closed with 2 layers of 0 Monocryl.  The first of which was running locked and the second was an imbricating layer.  This was noted to be hemostatic.  The gutters were cleared of all clot and debris.  The Alexis retractor was removed.  The peritoneum was reapproximated as were the rectus muscles with 2-0 Vicryl in a running fashion.  Subfascial planes were inspected and found to be hemostatic.  The fascia was then closed from either end overlapping in the midline with 0  Vicryl.  The subcuticular adipose layer was made hemostatic with Bovie cautery.  The dead space was closed with plain gut using 4-0 Vicryl on a Keith needle.  The skin was closed in subcuticular fashion.  Benzoin and Steri-Strips were applied.  The patient tolerated the procedure well.  Sponge, lap, and needle count were correct x2 at the end of the procedure.     Sherron MondayJody Bovard, MD     JB/MEDQ  D:  12/03/2016  T:  12/04/2016  Job:  [id number]

## 2016-12-06 LAB — RPR: RPR Ser Ql: NONREACTIVE

## 2016-12-06 MED ORDER — LABETALOL HCL 100 MG PO TABS: 100.0000 mg | ORAL_TABLET | Freq: Two times a day (BID) | ORAL | 1 refills | Status: DC

## 2016-12-06 MED ORDER — OXYCODONE HCL 5 MG PO TABS: 5.0000 mg | ORAL_TABLET | ORAL | 0 refills | Status: DC | PRN

## 2016-12-06 MED ORDER — IBUPROFEN 800 MG PO TABS: 800.0000 mg | ORAL_TABLET | Freq: Three times a day (TID) | ORAL | 1 refills | Status: DC

## 2016-12-06 MED ORDER — LABETALOL HCL 100 MG PO TABS
100.0000 mg | ORAL_TABLET | Freq: Two times a day (BID) | ORAL | Status: DC
  Administered 2016-12-06: 100 mg via ORAL
  Filled 2016-12-06: qty 1

## 2016-12-06 MED ORDER — PRENATAL MULTIVITAMIN CH: 1.0000 | ORAL_TABLET | Freq: Every day | ORAL | 3 refills | Status: DC

## 2016-12-06 NOTE — Progress Notes (Signed)
Pt ambulated  Well with  Husband  To nicu teaching complete

## 2016-12-06 NOTE — Progress Notes (Signed)
Subjective: Postpartum Day 3: Cesarean Delivery Patient reports incisional pain, tolerating PO and no problems voiding.    Objective: Vital signs in last 24 hours: Temp:  [98.4 F (36.9 C)-99 F (37.2 C)] 98.7 F (37.1 C) (01/18 0828) Pulse Rate:  [76-94] 78 (01/18 0828) Resp:  [17-18] 17 (01/18 0828) BP: (142-159)/(76-95) 148/95 (01/18 0828) SpO2:  [100 %] 100 % (01/18 0828)  Physical Exam:  General: alert and no distress Lochia: appropriate Uterine Fundus: firm Incision: healing well DVT Evaluation: No evidence of DVT seen on physical exam.   Recent Labs  12/04/16 0550  HGB 12.4  HCT 35.9*    Assessment/Plan: Status post Cesarean section. Doing well postoperatively.  Discharge home with standard precautions and return to clinic in 2 weeks.  D/C with Motrin and Oxycodone.    Bovard-Stuckert, Vitoria Conyer 12/06/2016, 9:09 AM

## 2016-12-06 NOTE — Final Progress Note (Signed)
Pt refused  TDAP FLU AND RUBELLA VACCINE  Pt aware of why they are encouraged  Information given MD aware  Pt refused this morning  DR Ellyn HackBOVARD

## 2016-12-06 NOTE — Lactation Note (Signed)
This note was copied from a baby's chart. Lactation Consultation Note  Patient Name: Michelle Holder ALPFX'T Date: 12/06/2016 Reason for consult: Follow-up assessment;NICU baby  NICU baby 31 hours old. Met with mom initially in NICU. Enc mom to call when paperwork filled out for pump. Mom then given pump--2-week rental. Mom knows to take pump kit pieces for use with rented pump and DEBP in pumping rooms. Mom reports that she has a personal DEBP for use after first 2-weeks. Mom reports that she is starting to obtain more than 10 ml from each breast. Enc mom to use maintenance setting, and given storage bottles. Mom aware of pumping rooms in the NICU, and how to obtain more labels. Enc mom to continue hand expressing after pumping, but mom states that she really does not like hand expressing. Discussed the benefits of hand expressing to EBM supply--supported by evidence/formal research. But mom given a manual pump with review to use as needed as well.   Mom aware of OP/BFSG and [place] phone line assistance after D/C.  Maternal Data    Feeding Feeding Type: Donor Breast Milk Length of feed: 15 min  LATCH Score/Interventions                      Lactation Tools Discussed/Used     Consult Status Consult Status: PRN    Andres Labrum 12/06/2016, 12:20 PM

## 2016-12-06 NOTE — Discharge Summary (Addendum)
OB Discharge Summary     Patient Name: Michelle Holder DOB: 11/09/1981 MRN: [id number]  Date of admission: 11/27/2016 Delivering MD: Sherian ReinBOVARD-STUCKERT, JODY   Date of discharge: 12/06/2016  Admitting diagnosis: 31W HBP Intrauterine pregnancy: 6526w1d     Secondary diagnosis:  Principal Problem:   S/P cesarean section Active Problems:   Pre-existing essential htn comp pregnancy, third trimester   Pregnancy induced hypertension, third trimester  Additional problems: 2/8 BPP     Discharge diagnosis: Preterm Pregnancy Delivered, Preeclampsia (severe) and CHTN                                                                                                Post partum procedures:N/A  Augmentation: N/A  Complications: None  Hospital course: Admitted with elevated BP, low 2/8 BPP for emergent delivery.  rLTC/S   36 y.o. yo [id number] at 6926w1d was admitted to the hospital 11/27/2016 for increased BP/Superimposed PreE/CHTN with the following indication:decreased BPP.  Membrane Rupture Time/Date: 6:41 PM ,12/03/2016   Patient delivered a Viable infant.12/03/2016  Details of operation can be found in separate operative note.  Pateint had an uncomplicated postpartum course.  She is ambulating, tolerating a regular diet, passing flatus, and urinating well. Patient is discharged home in stable condition on  12/06/16         Physical exam  Vitals:   12/05/16 2015 12/05/16 2105 12/06/16 0423 12/06/16 0828  BP: (!) 159/95 (!) 152/94 (!) 157/89 (!) 148/95  Pulse:  93 76 78  Resp: 18   17  Temp: 99 F (37.2 C)   98.7 F (37.1 C)  TempSrc:    Oral  SpO2:    100%  Weight:      Height:       General: alert and no distress Lochia: appropriate Uterine Fundus: firm Incision: Healing well with no significant drainage DVT Evaluation: No evidence of DVT seen on physical exam. Labs: Lab Results  Component Value Date   WBC 11.3 (H) 12/04/2016   HGB 12.4 12/04/2016   HCT 35.9 (L) 12/04/2016    MCV 91.6 12/04/2016   PLT 238 12/04/2016   CMP Latest Ref Rng & Units 12/03/2016  Glucose 65 - 99 mg/dL 94  BUN 6 - 20 mg/dL 12  Creatinine 6.570.44 - 8.461.00 mg/dL 9.620.99  Sodium 952135 - 841145 mmol/L 135  Potassium 3.5 - 5.1 mmol/L 4.0  Chloride 101 - 111 mmol/L 107  CO2 22 - 32 mmol/L 23  Calcium 8.9 - 10.3 mg/dL 3.2(G8.6(L)  Total Protein 6.5 - 8.1 g/dL 6.2(L)  Total Bilirubin 0.3 - 1.2 mg/dL 0.7  Alkaline Phos 38 - 126 U/L 88  AST 15 - 41 U/L 24  ALT 14 - 54 U/L 16    Discharge instruction: per After Visit Summary and "Baby and Me Booklet".  After visit meds:  Allergies as of 12/06/2016   No Known Allergies     Medication List    TAKE these medications   cyclobenzaprine 10 MG tablet Commonly known as:  FLEXERIL Take 1 tablet (10 mg total) by mouth 3 (three) times daily as  needed for muscle spasms.   ibuprofen 800 MG tablet Commonly known as:  ADVIL,MOTRIN Take 1 tablet (800 mg total) by mouth every 8 (eight) hours.   labetalol 100 MG tablet Commonly known as:  NORMODYNE Take 1 tablet (100 mg total) by mouth 2 (two) times daily. What changed:  medication strength  how much to take   oxyCODONE 5 MG immediate release tablet Commonly known as:  Oxy IR/ROXICODONE Take 1 tablet (5 mg total) by mouth every 4 (four) hours as needed (pain scale 4-7).   prenatal multivitamin Tabs tablet Take 1 tablet by mouth daily at 12 noon.       Diet: routine diet  Activity: Advance as tolerated. Pelvic rest for 6 weeks.   Outpatient follow up:1 and 6 weeks Follow up Appt:No future appointments. Follow up Visit:No Follow-up on file.  Postpartum contraception: Undecided  Newborn Data: Live born female  Birth Weight: 3 lb 3.2 oz (1450 g) APGAR: 8, 8  Baby Feeding: Breast Disposition:home with mother   12/06/2016 Sherian Rein, MD

## 2016-12-06 NOTE — Clinical Social Work Maternal (Signed)
  CLINICAL SOCIAL WORK MATERNAL/CHILD NOTE  Patient Details  Name: Michelle Holder MRN: [id number] Date of Birth: 25-May-1981  Date:  12/06/2016  Clinical Social Worker Initiating Note:  Laurey Arrow Date/ Time Initiated:  12/06/16/1258     Child's Name:  Michelle Holder   Legal Guardian:  Mother   Need for Interpreter:  None   Date of Referral:  12/04/16     Reason for Referral:   (NICU admission [redacted]w[redacted]d   Referral Source:  NICU   Address:  [address] Phone number:  [phone]  Household Members:  Self, Minor Children, Spouse   Natural Supports (not living in the home):  Immediate Family, Extended Family, Parent   Professional Supports: None   Employment: Unemployed   Type of Work:     Education:  HDatabase administratorResources:      Other Resources:  FPhysicist, medical   Cultural/Religious Considerations Which May Impact Care:  None Reported  Strengths:  Ability to meet basic needs , Home prepared for child    Risk Factors/Current Problems:  None   Cognitive State:  Alert , Able to Concentrate , Linear Thinking    Mood/Affect:  Apprehensive , Comfortable , Calm , Relaxed    CSW Assessment: CSW met with [name] to complete an assessment for NICU admission. When CSW arrived, [name] was in bed resting.  CSW inquired about [name]'s thoughts and feelings about NICU admission. [name] expressed that initially [name] was nervous, but is feeling better due to infant's progress.  CSW validated and normalized [name]'s thoughts and feelings. CSW inquired about [name]'s support, and [name] reported that [name] and FOB have a wealth of support from [name]'s immediate to extended family members. CSW assessed for MH, SA, and DV hx; [name] denied them all. CSW educated [name] about PPD. CSW informed [name] of possible supports and interventions to decrease PPD.  CSW also encouraged [name] to seek medical attention if needed for increased signs and symptoms for PPD.  [name] denied any psychosocial stressors and reported that [name] has car seats and a safe place for the infant to sleep. CSW will continue to assess family for barriers, concerns, and needed resources while infant is in the NICU. CSW provided [name] with CSW contact information.   CSW Plan/Description:  Psychosocial Support and Ongoing Assessment of Needs, Patient/Family Education    ALaurey Arrow MSW, LCSW Clinical Social Work ([phone]   ADimple Nanas LCSW 12/06/2016, 1:10 PM

## 2016-12-19 ENCOUNTER — Ambulatory Visit: Payer: Self-pay

## 2016-12-19 NOTE — Lactation Note (Signed)
This note was copied from a baby's chart. Lactation Consultation Note  Patient Name: Michelle Holder [id number]'s Date: 12/19/2016 Reason for consult: Follow-up assessment  With this mom of a 952 week old NICU baby, now 634 3/7 weeks CGA, and small, weight 3 lbs 12/5 oz. Mom is pumping 6-7 times a day, and expressing up to 5 oz at a time. Mom is worried about using her home Medela DEP, fearing it may not work as well. I advised mom to try the home pump, and see how it does compared to the hospital grade pump. If mom prefers the hospital grade, then she can extend her pump rental to a full month, and pay the difference. I gave mom the lactation store phone number.  Mom also said her 38 year old wants to breast feed , when she sees her mom pump. Mom said she is pumping first, and then lets her 1 year breastfeed at night, to go to sleep. I told her this was fine, and once her NICU baby goes home, she can come in for o/p consults, and we can help her transition her baby to full breastfeeding, and tandem breastfeeding can also be discussed at that time. Mom knows to call for questions/conerns.    Maternal Data    Feeding Feeding Type: Breast Milk Length of feed: 30 min  LATCH Score/Interventions                      Lactation Tools Discussed/Used     Consult Status      Michelle LevinsLee, Michelle Holder 12/19/2016, 1:49 PM

## 2016-12-24 ENCOUNTER — Ambulatory Visit: Payer: Self-pay

## 2016-12-24 NOTE — Lactation Note (Signed)
This note was copied from a baby's chart. Lactation Consultation Note  Patient Name: [name] [id number]'s Date: 12/24/2016  Follow up visit with mom in NICU.  She states pumping is going well and she obtains 5 ounces each pumping.  Mom asking questions about how to increase volume as baby requires more.  Discussed increasing frequency and power pumping.  Mom is not putting baby to breast.  Encouraged to attempt nuzzling and latching to breast.  Mom states baby will be discharged in the next day or two.  She will call for an outpatient appointment.   Maternal Data    Feeding Feeding Type: Breast Milk Nipple Type: Slow - flow Length of feed: 15 min  LATCH Score/Interventions                      Lactation Tools Discussed/Used     Consult Status      Huston FoleyMOULDEN, Quentez Lober S 12/24/2016, 11:23 AM

## 2017-01-25 ENCOUNTER — Inpatient Hospital Stay (HOSPITAL_COMMUNITY)
Admission: RE | Admit: 2017-01-25 | Payer: Medicaid Other | Source: Ambulatory Visit | Admitting: Obstetrics and Gynecology

## 2017-01-25 ENCOUNTER — Encounter (HOSPITAL_COMMUNITY): Admission: RE | Payer: Self-pay | Source: Ambulatory Visit

## 2017-01-25 SURGERY — Surgical Case
Anesthesia: Regional

## 2017-07-14 IMAGING — CT CT ABD-PELV W/ CM
2 of 4 series · 16 of 46 positions shown, 18 images · IV contrast (iopamidol)
Comparison: None.

CLINICAL DATA: Abdominal pain 3 days. Muscle spasms over abdominal
region after working out. Associated chills and headache. Gastric
sleeve procedure 5 years ago.

EXAM:
CT ABDOMEN AND PELVIS WITH CONTRAST
TECHNIQUE: Multidetector CT imaging of the abdomen and pelvis was performed
using the standard protocol following bolus administration of
intravenous contrast.
CONTRAST:  100mL AZEF7M-OII IOPAMIDOL (AZEF7M-OII) INJECTION 61%

[Series 2: routine abd pel with · axial · 0.75mm/px · z∈[-978,-518]mm · 13 of 100 slices shown, 15 images]
[im 4/100  soft-tissue]
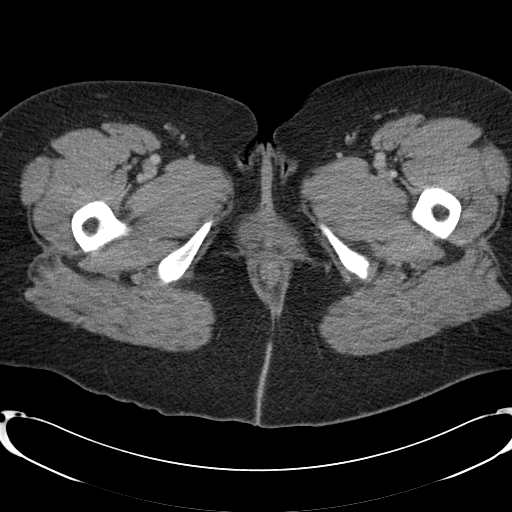
[im 4/100  bone]
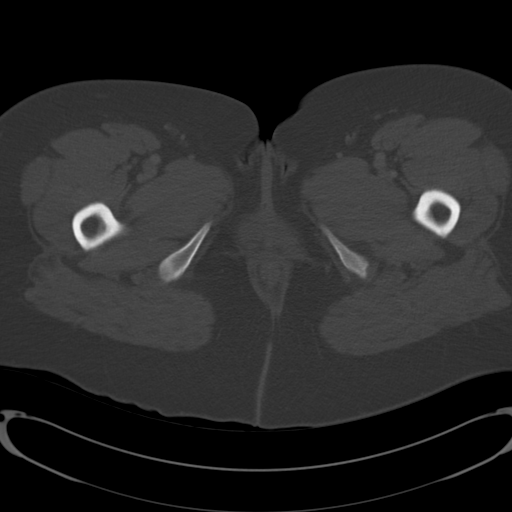
[im 12/100  soft-tissue]
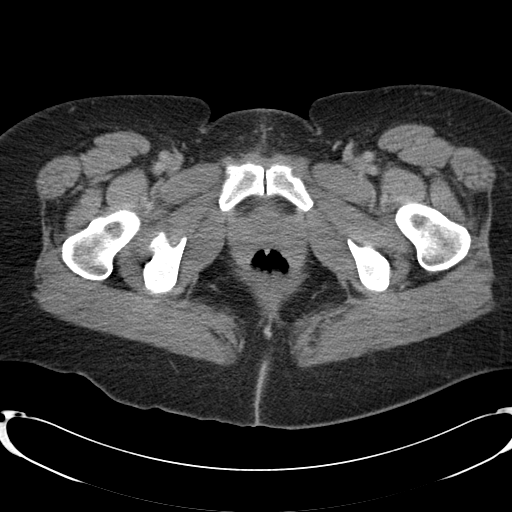
[im 20/100  soft-tissue]
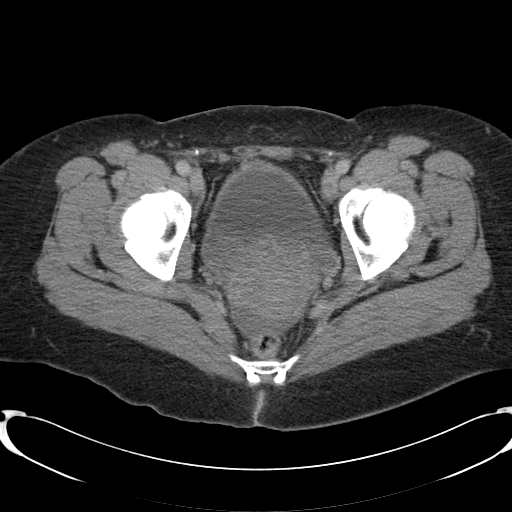
[im 28/100  soft-tissue]
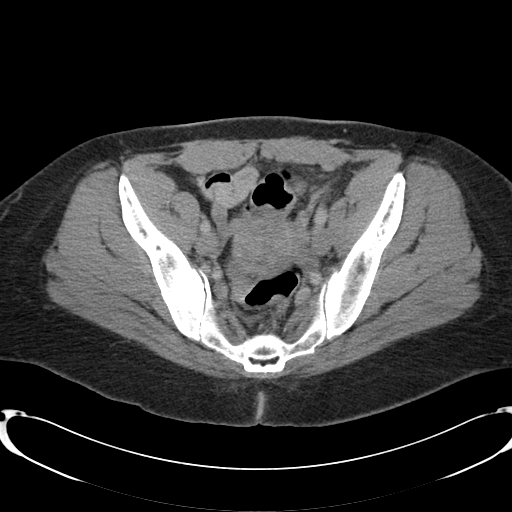
[im 36/100  soft-tissue]
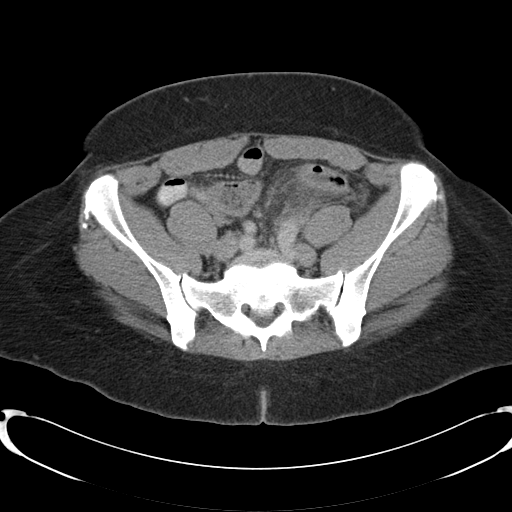
[im 44/100  soft-tissue]
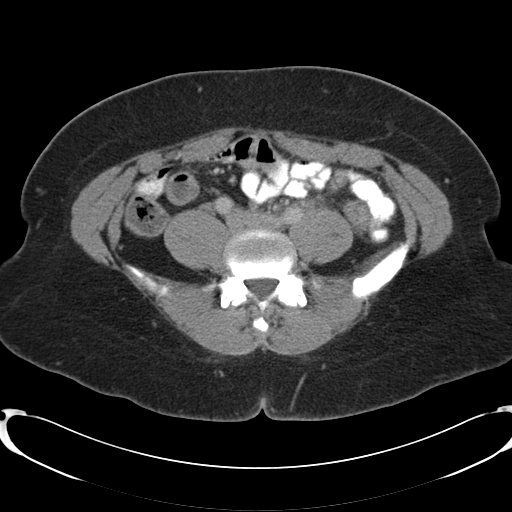
[im 52/100  soft-tissue]
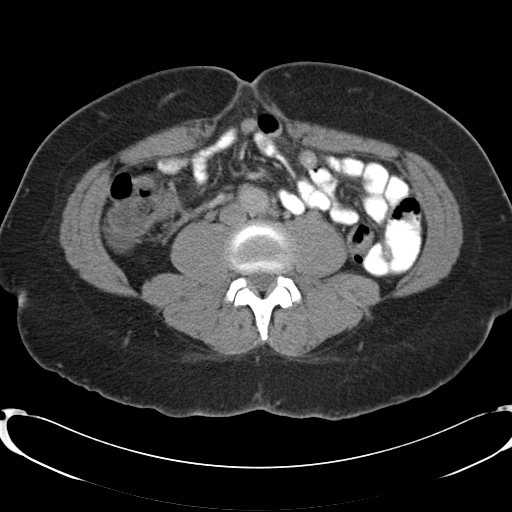
[im 56/100  soft-tissue]
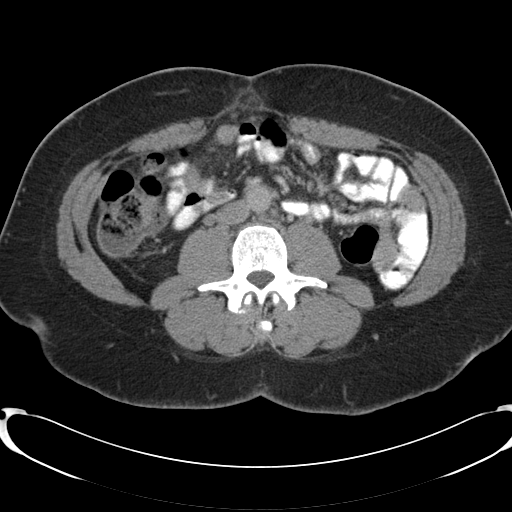
[im 64/100  soft-tissue]
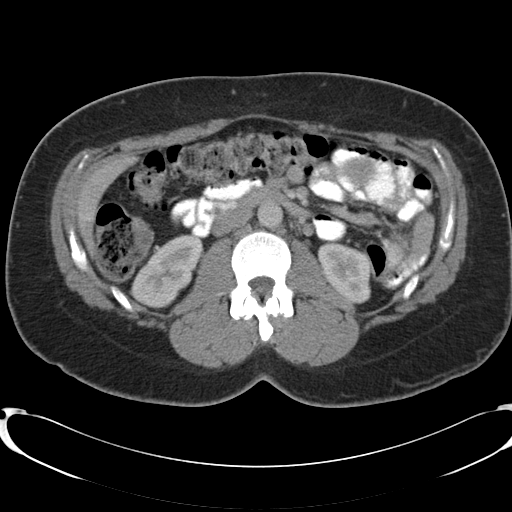
[im 64/100  bone]
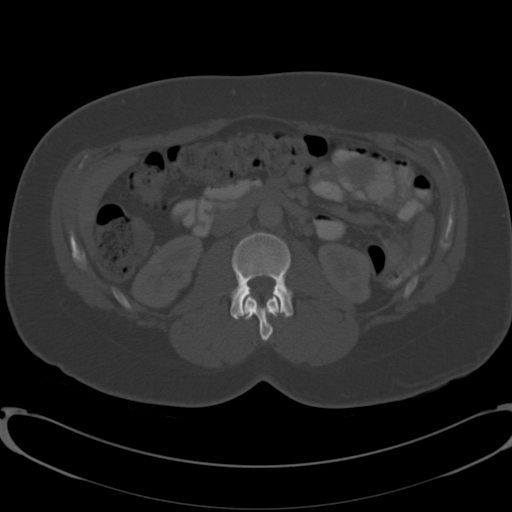
[im 72/100  soft-tissue]
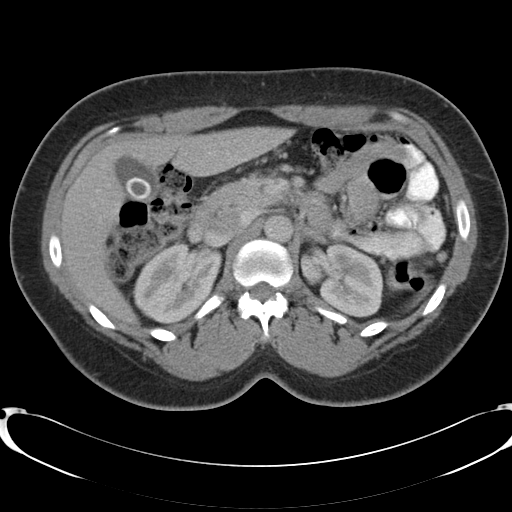
[im 80/100  soft-tissue]
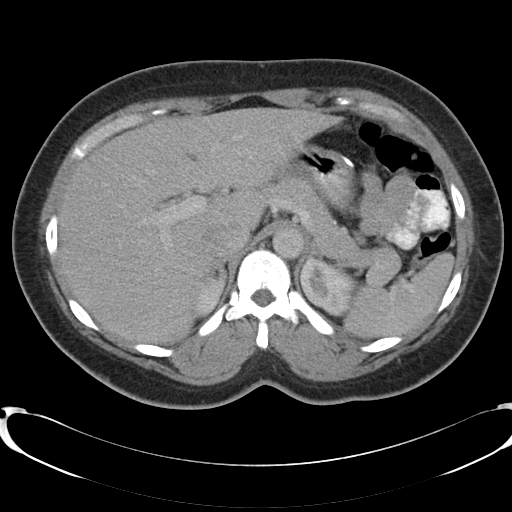
[im 88/100  soft-tissue]
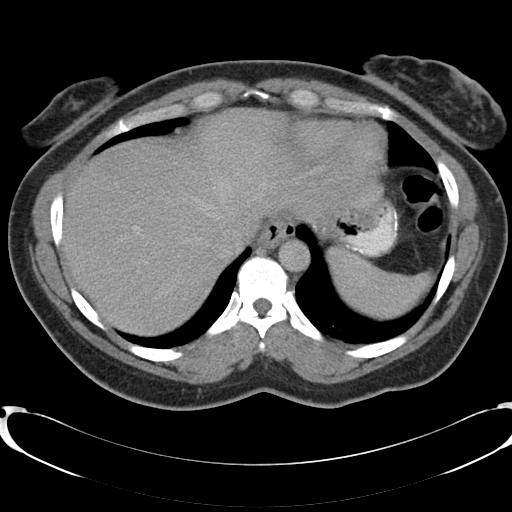
[im 96/100  soft-tissue]
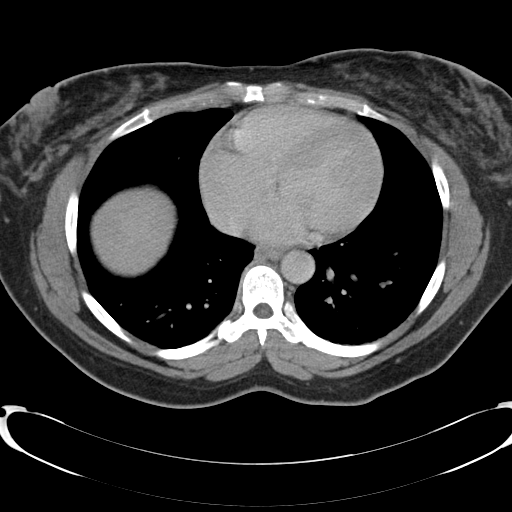

[Series 5: cor routine abd pel with · coronal · 0.72mm/px · 3 of 129 slices shown]
[im 43/129  soft-tissue]
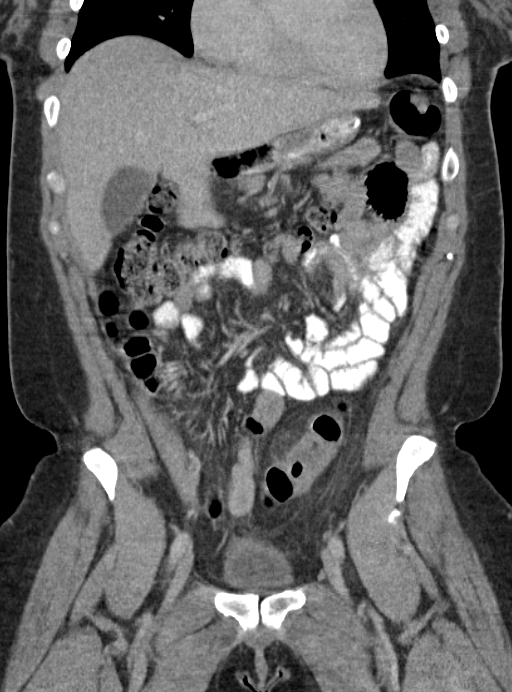
[im 57/129  soft-tissue]
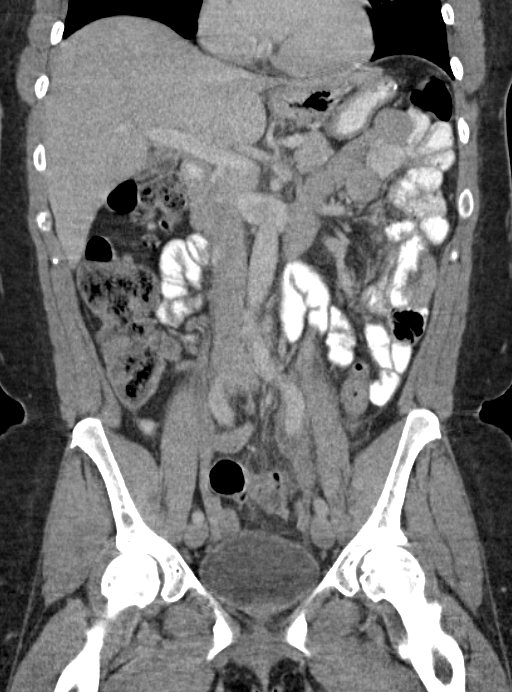
[im 72/129  soft-tissue]
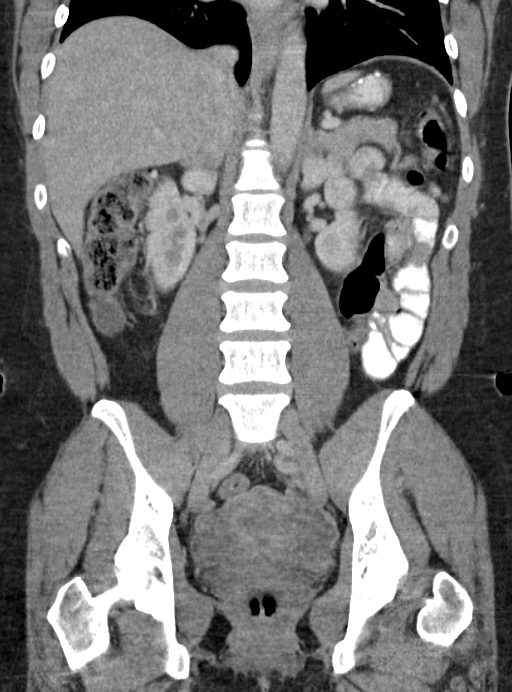

[16 of 46 positions shown; findings below may reference images not displayed]

FINDINGS: Lung bases are within normal.

Abdominal images demonstrate a normal liver, spleen, pancreas and
adrenal glands. There is moderate cholelithiasis.

Kidneys normal size without hydronephrosis or nephrolithiasis.

Vascular structures are within normal.

Postsurgical changes compatible previous gastric bypass procedure.
Appendix is normal. Small bowel is within normal.

The colon demonstrates mild diverticulosis. There is mild
inflammatory change adjacent a short segment of the sigmoid colon in
the left lower quadrant. There is no evidence of diverticular
abscess or perforation. Findings are likely due to acute
diverticulitis.

Small umbilical/periumbilical hernia containing only peritoneal fat.

Pelvic images demonstrate a normal uterus, ovaries, bladder and
rectum. There is a small amount of free fluid in the cul-de-sac.

There mild degenerate changes of the hips and sacroiliac joints.
IMPRESSION: Evidence of acute diverticulitis of a short segment of the sigmoid
colon in the left lower quadrant. No evidence of diverticular
abscess or perforation.

Moderate cholelithiasis.

Small periumbilical/umbilical hernia containing only peritoneal fat.

Small amount of free pelvic fluid.

Evidence of previous gastric bypass procedure.

## 2017-10-07 ENCOUNTER — Encounter: Payer: Self-pay | Admitting: Family Medicine

## 2017-10-07 ENCOUNTER — Ambulatory Visit (INDEPENDENT_AMBULATORY_CARE_PROVIDER_SITE_OTHER): Payer: Self-pay | Admitting: Family Medicine

## 2017-10-07 VITALS — BP 128/78 | HR 102 | Temp 97.9°F | Resp 16 | Ht 69.0 in | Wt 252.9 lb

## 2017-10-07 DIAGNOSIS — E041 Nontoxic single thyroid nodule: Secondary | ICD-10-CM

## 2017-10-07 DIAGNOSIS — Z8639 Personal history of other endocrine, nutritional and metabolic disease: Secondary | ICD-10-CM

## 2017-10-07 DIAGNOSIS — R1031 Right lower quadrant pain: Secondary | ICD-10-CM

## 2017-10-07 DIAGNOSIS — E559 Vitamin D deficiency, unspecified: Secondary | ICD-10-CM

## 2017-10-07 DIAGNOSIS — M62838 Other muscle spasm: Secondary | ICD-10-CM

## 2017-10-07 DIAGNOSIS — M79604 Pain in right leg: Secondary | ICD-10-CM

## 2017-10-07 MED ORDER — IBUPROFEN 800 MG PO TABS: 800.0000 mg | ORAL_TABLET | Freq: Three times a day (TID) | ORAL | 0 refills | Status: DC

## 2017-10-07 MED ORDER — TIZANIDINE HCL 4 MG PO TABS: 4.0000 mg | ORAL_TABLET | Freq: Every evening | ORAL | 0 refills | Status: DC | PRN

## 2017-10-07 NOTE — Addendum Note (Signed)
Addended by: Doren CustardBOYCE, Jaylaa Gallion E on: 10/07/2017 02:47 PM   Modules accepted: Orders

## 2017-10-07 NOTE — Progress Notes (Signed)
Name: Michelle Holder   MRN: [id number]    DOB: 10/06/1981   Date:10/07/2017       Progress Note  Subjective  Chief Complaint  Chief Complaint  Patient presents with  . Groin Pain    groin strain. right leg for 1 month. Crampins, spasm. Patients works out 5 to 6 days a week  . Goiter    new found goiter, would like labs    HPI  RIGHT Hip Pain: Notes that she has had ongoing right hip pain x1 month. She has been working out with [employer] 5-6 times a week.  She has been working to help strengthen her hip flexors, but notices that she is having upper lateral and medial thigh pain and muscle spasms. No recent acute injury. She notes occasionally feeling RLE weakness upon initial standing from sitting - remedied by adjusting the way that she gets out of the car, out of bed, etc. Denies numbness/tingling.  Possible Goiter: Patient notes history of thyroid nodule, she feels like it has grown recently. She would like her thyroid panel to be checked again. She had US performed on 08/18/2017 which noted a complex nodule in the mid upper pole of the right thyroid lobe.  She notes that she had a hyperactive thyroid that then regulated to normal levels after a few months. Has not had any issues since then.  Denies palpitations, skin/hair/nail changes, some mild cold intolerance (also has history IDA - will check today).  Patient Active Problem List   Diagnosis Date Noted  . S/P cesarean section 12/03/2016  . Pregnancy induced hypertension, third trimester 11/27/2016  . Hypertension affecting pregnancy in third trimester 11/23/2016  . Chronic hypertension with superimposed pre-eclampsia 11/20/2016  . Pre-existing essential htn comp pregnancy, third trimester 11/20/2016  . Diverticulitis of intestine with abscess 04/08/2016  . Diverticulitis of large intestine with abscess without bleeding   . Dysmenorrhea 07/07/2015  . History of iron deficiency 07/07/2015  . Thyroid nodule 07/07/2015  .  Vitamin D deficiency 07/07/2015  . Adult BMI 30+ 03/23/2010  . Benign essential HTN 02/20/2010    Social History   Tobacco Use  . Smoking status: Never Smoker  . Smokeless tobacco: Never Used  Substance Use Topics  . Alcohol use: No    Alcohol/week: 0.0 oz     Current Outpatient Medications:  .  cyclobenzaprine (FLEXERIL) 10 MG tablet, Take 1 tablet (10 mg total) by mouth 3 (three) times daily as needed for muscle spasms. (Patient not taking: Reported on 10/07/2017), Disp: 30 tablet, Rfl: 0 .  ibuprofen (ADVIL,MOTRIN) 800 MG tablet, Take 1 tablet (800 mg total) by mouth every 8 (eight) hours. (Patient not taking: Reported on 10/07/2017), Disp: 45 tablet, Rfl: 1 .  labetalol (NORMODYNE) 100 MG tablet, Take 1 tablet (100 mg total) by mouth 2 (two) times daily. (Patient not taking: Reported on 10/07/2017), Disp: 60 tablet, Rfl: 1 .  labetalol (NORMODYNE) 100 MG tablet, Take 1 tablet (100 mg total) by mouth 2 (two) times daily. (Patient not taking: Reported on 10/07/2017), Disp: 60 tablet, Rfl: 1 .  oxyCODONE (OXY IR/ROXICODONE) 5 MG immediate release tablet, Take 1 tablet (5 mg total) by mouth every 4 (four) hours as needed (pain scale 4-7). (Patient not taking: Reported on 10/07/2017), Disp: 40 tablet, Rfl: 0 .  Prenatal Vit-Fe Fumarate-FA (PRENATAL MULTIVITAMIN) TABS tablet, Take 1 tablet by mouth daily at 12 noon. (Patient not taking: Reported on 10/07/2017), Disp: 100 tablet, Rfl: 3  No Known Allergies  ROS  Constitutional: Negative for fever or weight change.  Respiratory: Negative for cough and shortness of breath.   Cardiovascular: Negative for chest pain or palpitations.  Gastrointestinal: Negative for abdominal pain, no bowel changes.  Musculoskeletal: Negative for gait problem or joint swelling. See HPI Skin: Negative for rash.  Neurological: Negative for dizziness or headache.  No other specific complaints in a complete review of systems (except as listed in HPI  above).  Objective  Vitals:   10/07/17 1348  BP: 128/78  Pulse: (!) 102  Resp: 16  Temp: 97.9 F (36.6 C)  TempSrc: Oral  SpO2: 97%  Weight: 252 lb 14.4 oz (114.7 kg)  Height: 5\' 9"  (1.753 m)   Body mass index is 37.35 kg/m.  Nursing Note and Vital Signs reviewed.  Physical Exam  Constitutional: Patient appears well-developed and well-nourished. Obese. No distress.  HEENT: head atraumatic, normocephalic Cardiovascular: Normal rate, regular rhythm, S1/S2 present.  No murmur or rub heard. No BLE edema. Pulmonary/Chest: Effort normal and breath sounds clear. No respiratory distress or retractions. Psychiatric: Patient has a normal mood and affect. behavior is normal. Judgment and thought content normal. Musculoskeletal: RIGHT LE limited Abduction - cannot perform actively, only on PROM. RLE AROM is limited for adduction as well. RLE strength is decreased against resistance secondary to pain; LLE strength is WNL.  LLE AROM normal. No joint effusions. No gross deformities Neurological: she is alert and oriented to person, place, and time. No cranial nerve deficit. Coordination, balance, speech and gait are normal.  Skin: Skin is warm and dry. No rash noted. No erythema.   No results found for this or any previous visit (from the past 2160 hour(s)).   Assessment & Plan  1. Acute pain of right lower extremity - ibuprofen (ADVIL,MOTRIN) 800 MG tablet; Take 1 tablet (800 mg total) every 8 (eight) hours by mouth.  Dispense: 30 tablet; Refill: 0 - Ambulatory referral to Orthopedic Surgery - Advised to rest from exercise until she sees Ortho - only activity as tolerated. - Heat and ice PRN - Discussed weakness red flags in detail as below.  2. Thyroid nodule - Thyroid Panel With TSH  3. History of iron deficiency - CBC  4. Vitamin D deficiency - Vitamin D (25 hydroxy)  5. Muscle spasm - Magnesium - Basic Metabolic Panel (BMET) - Vitamin B12 - Ambulatory referral to  Orthopedic Surgery - tiZANidine (ZANAFLEX) 4 MG tablet; Take 1 tablet (4 mg total) at bedtime as needed by mouth for muscle spasms. Take 1/2 tab x2-3 days, then take 1 tab at night as needed.  Dispense: 30 tablet; Refill: 0 - Patient requests form be completed to allow for massage coverage for RLE, this is completed and advised she may have massage up to one time a week x3 months as needed, then re-evaluation.    6. Right groin pain - ibuprofen (ADVIL,MOTRIN) 800 MG tablet; Take 1 tablet (800 mg total) every 8 (eight) hours by mouth.  Dispense: 30 tablet; Refill: 0 - Ambulatory referral to Orthopedic Surgery - tiZANidine (ZANAFLEX) 4 MG tablet; Take 1 tablet (4 mg total) at bedtime as needed by mouth for muscle spasms. Take 1/2 tab x2-3 days, then take 1 tab at night as needed.  Dispense: 30 tablet; Refill: 0  -Red flags and when to present for emergency care or RTC including fever >101.4F,saddle anesthesia, RLE weakness that does not resolve with repositioning, new/worsening/un-resolving symptoms, reviewed with patient at time of visit. Follow up and care instructions discussed  and provided in AVS.

## 2017-10-09 ENCOUNTER — Telehealth: Payer: Self-pay | Admitting: Family Medicine

## 2017-10-09 NOTE — Telephone Encounter (Signed)
Copied from CRM [id number]. Topic: Inquiry >> Oct 09, 2017 11:13 AM Alexander BergeronBarksdale, Harvey B wrote: Reason for CRM: PT is calling to get her lab orders sent over to [facility], she has an active lab that was opened on 11.19.18 fax to [phone] / Please fax orders if possible to [facility]. /

## 2017-10-09 NOTE — Telephone Encounter (Signed)
Copied from CRM [id number]. Topic: Quick Communication - See Telephone Encounter >> Oct 09, 2017  4:44 PM Trula SladeWalter, Linda F wrote: CRM for notification. See Telephone encounter for:  10/09/17. Patient would like to try another muscle relaxer because the one she was given is giving her headaches and she is still not able to sleep.

## 2017-10-11 ENCOUNTER — Other Ambulatory Visit: Payer: Self-pay

## 2017-10-11 DIAGNOSIS — E041 Nontoxic single thyroid nodule: Secondary | ICD-10-CM

## 2017-10-11 DIAGNOSIS — E559 Vitamin D deficiency, unspecified: Secondary | ICD-10-CM

## 2017-10-11 DIAGNOSIS — Z8639 Personal history of other endocrine, nutritional and metabolic disease: Secondary | ICD-10-CM

## 2017-10-11 DIAGNOSIS — M62838 Other muscle spasm: Secondary | ICD-10-CM

## 2017-10-11 MED ORDER — CYCLOBENZAPRINE HCL 5 MG PO TABS: 5.0000 mg | ORAL_TABLET | Freq: Three times a day (TID) | ORAL | 0 refills | Status: DC | PRN

## 2017-10-11 NOTE — Telephone Encounter (Signed)
Sent cyclobenzaprine to [facility]

## 2017-10-17 ENCOUNTER — Other Ambulatory Visit: Payer: Self-pay | Admitting: Family Medicine

## 2017-10-17 DIAGNOSIS — E559 Vitamin D deficiency, unspecified: Secondary | ICD-10-CM

## 2017-10-17 LAB — BASIC METABOLIC PANEL
BUN/Creatinine Ratio: 12 (ref 9–23)
BUN: 14 mg/dL (ref 6–20)
CALCIUM: 9.6 mg/dL (ref 8.7–10.2)
CO2: 28 mmol/L (ref 20–29)
CREATININE: 1.2 mg/dL — AB (ref 0.57–1.00)
Chloride: 99 mmol/L (ref 96–106)
GFR calc Af Amer: 66 mL/min/{1.73_m2} (ref >59)
GFR calc non Af Amer: 57 mL/min/{1.73_m2} — ABNORMAL LOW (ref >59)
Glucose: 57 mg/dL — ABNORMAL LOW (ref 65–99)
Potassium: 4.5 mmol/L (ref 3.5–5.2)
Sodium: 139 mmol/L (ref 134–144)

## 2017-10-17 LAB — CBC WITH DIFFERENTIAL/PLATELET
BASOS ABS: 0 10*3/uL (ref 0.0–0.2)
Basos: 0 %
EOS (ABSOLUTE): 0.1 10*3/uL (ref 0.0–0.4)
Eos: 1 %
HEMOGLOBIN: 13.3 g/dL (ref 11.1–15.9)
Hematocrit: 41.1 % (ref 34.0–46.6)
IMMATURE GRANS (ABS): 0 10*3/uL (ref 0.0–0.1)
IMMATURE GRANULOCYTES: 0 %
LYMPHS: 22 %
Lymphocytes Absolute: 1.1 10*3/uL (ref 0.7–3.1)
MCH: 29.2 pg (ref 26.6–33.0)
MCHC: 32.4 g/dL (ref 31.5–35.7)
MCV: 90 fL (ref 79–97)
MONOCYTES: 6 %
Monocytes Absolute: 0.3 10*3/uL (ref 0.1–0.9)
NEUTROS ABS: 3.6 10*3/uL (ref 1.4–7.0)
Neutrophils: 71 %
Platelets: 315 10*3/uL (ref 150–379)
RBC: 4.56 x10E6/uL (ref 3.77–5.28)
RDW: 13.3 % (ref 12.3–15.4)
WBC: 5.1 10*3/uL (ref 3.4–10.8)

## 2017-10-17 LAB — THYROID PANEL WITH TSH
Free Thyroxine Index: 2 (ref 1.2–4.9)
T3 Uptake Ratio: 27 % (ref 24–39)
T4 TOTAL: 7.4 ug/dL (ref 4.5–12.0)
TSH: 0.769 u[IU]/mL (ref 0.450–4.500)

## 2017-10-17 LAB — VITAMIN D 25 HYDROXY (VIT D DEFICIENCY, FRACTURES): Vit D, 25-Hydroxy: 17.3 ng/mL — ABNORMAL LOW (ref 30.0–100.0)

## 2017-10-17 LAB — VITAMIN B12: Vitamin B-12: 824 pg/mL (ref 232–1245)

## 2017-10-17 LAB — MAGNESIUM: MAGNESIUM: 2 mg/dL (ref 1.6–2.3)

## 2017-10-17 MED ORDER — VITAMIN D (ERGOCALCIFEROL) 1.25 MG (50000 UNIT) PO CAPS: 50000.0000 [IU] | ORAL_CAPSULE | ORAL | 0 refills | Status: DC

## 2017-10-30 NOTE — Telephone Encounter (Signed)
There are no lab orders that have not been resulted in the system. No standing lab orders.

## 2018-03-12 IMAGING — US US MFM FETAL BPP W/O NON-STRESS
1 series · 12 of 28 positions shown · non-contrast
Comparison: none

[Series 1: us mfm fetal bpp w/o non-stress · 30 acquisitions, 12 frames shown]
[im 2/30]
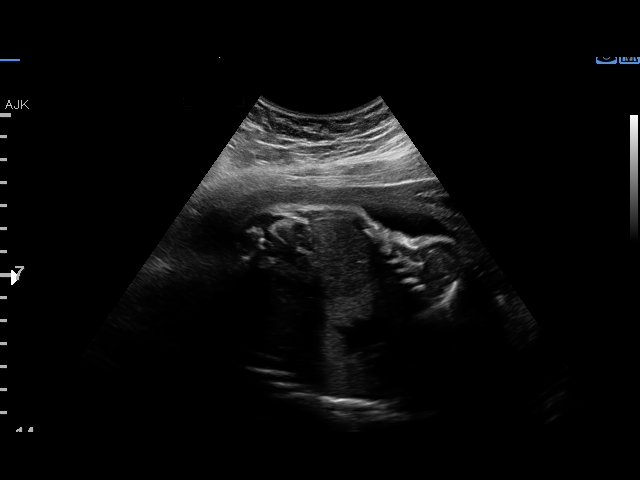
[im 4/30]
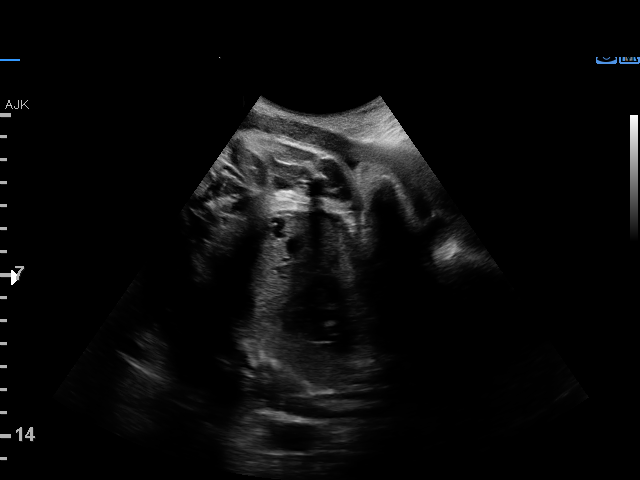
[im 6/30]
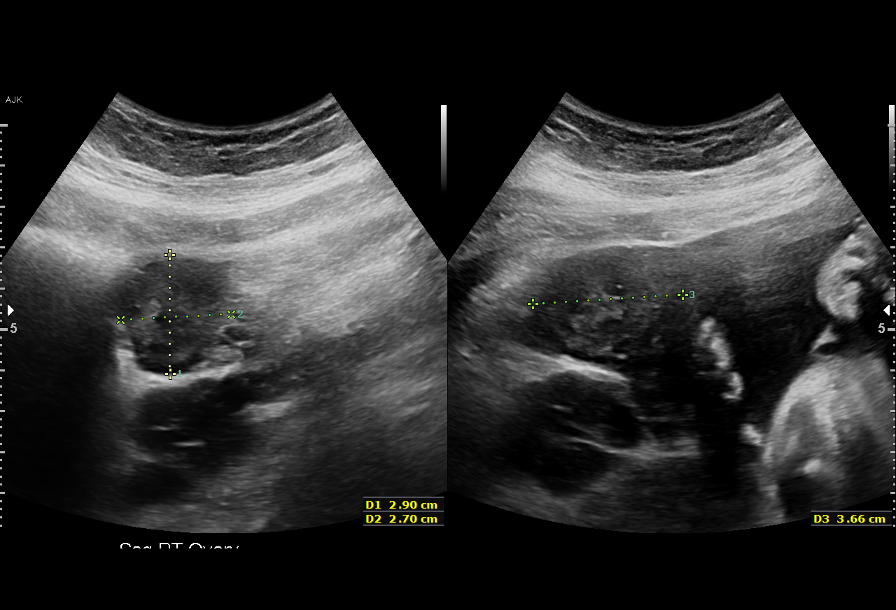
[im 9/30]
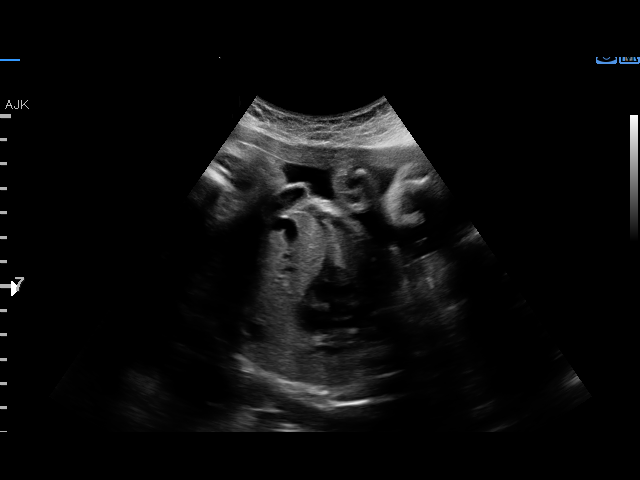
[im 11/30]
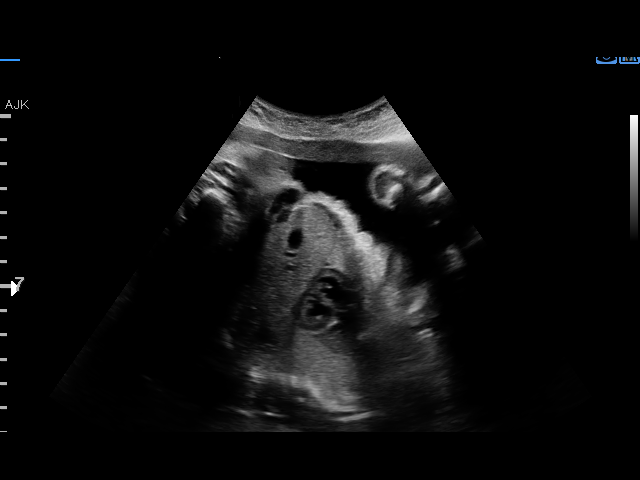
[im 13/30]
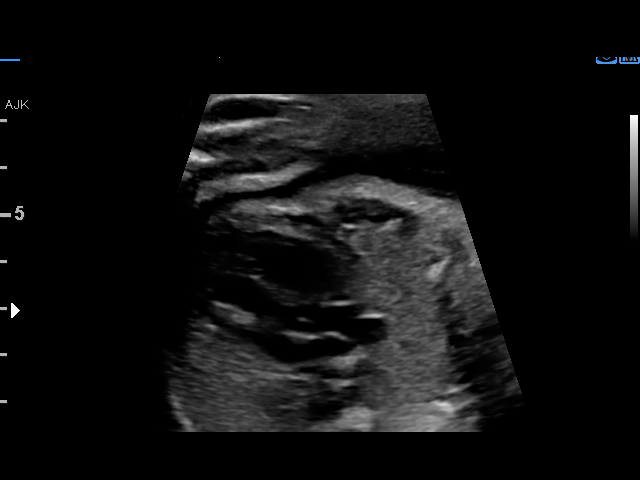
[im 17/30]
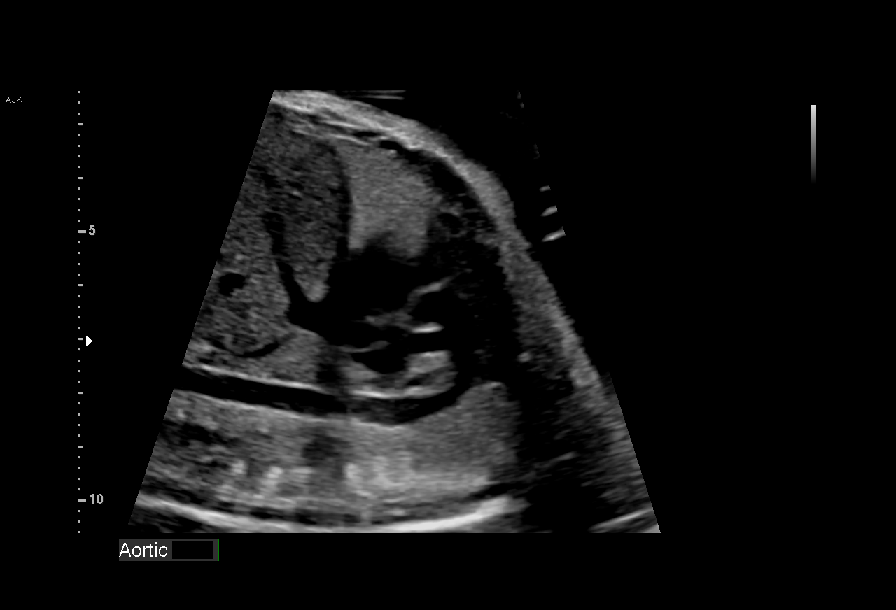
[im 19/30]
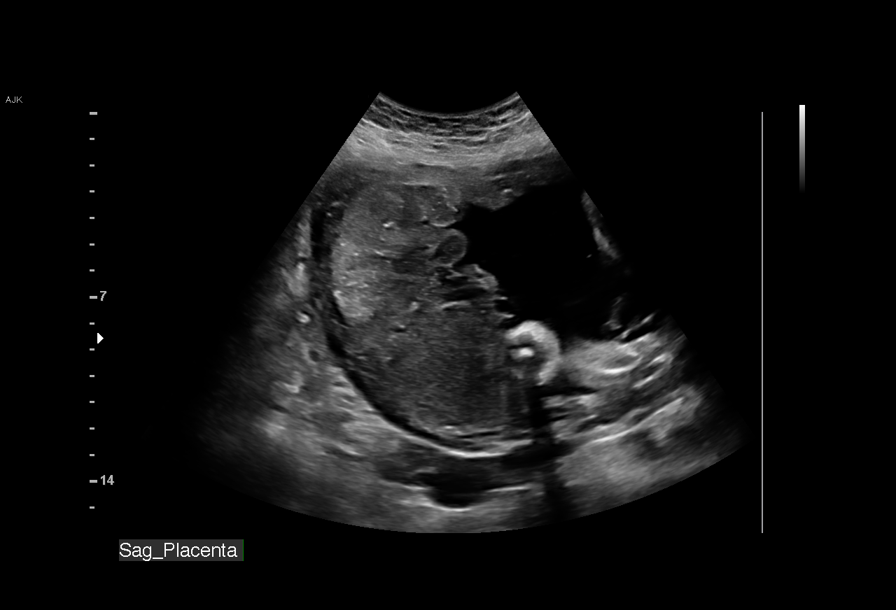
[im 21/30]
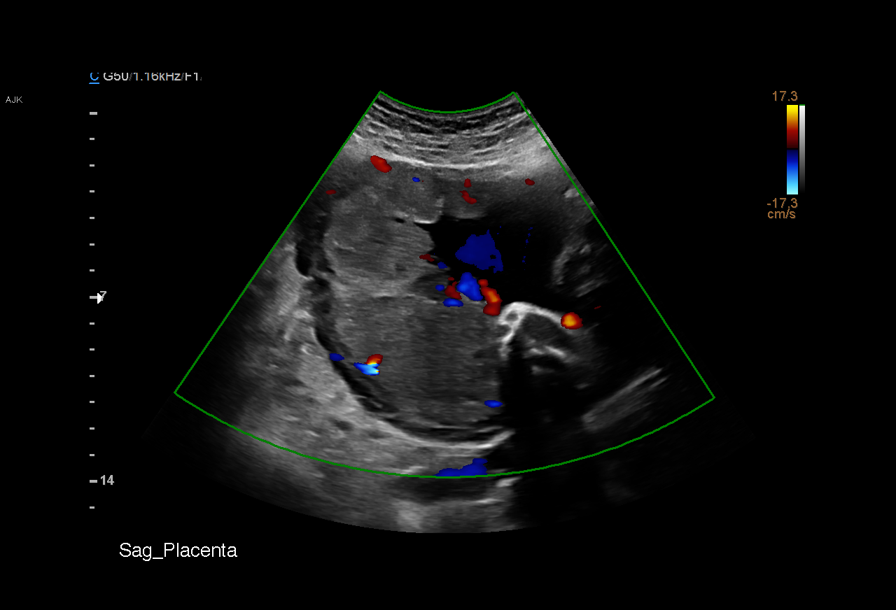
[im 24/30]
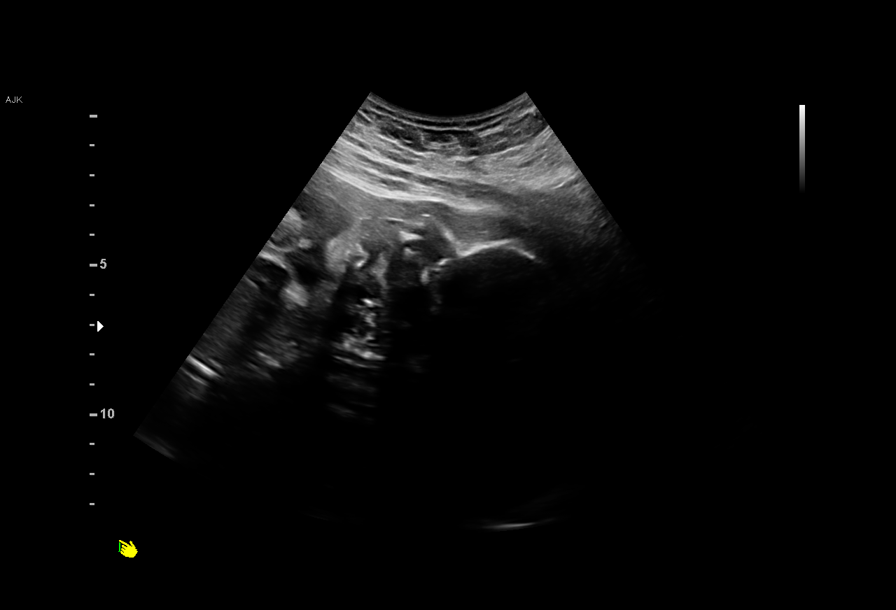
[im 26/30]
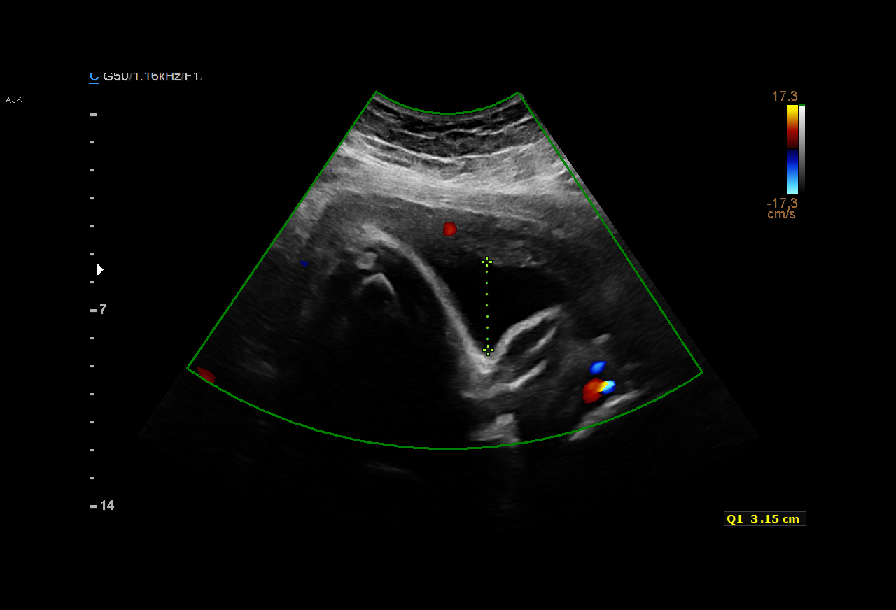
[im 28/30]
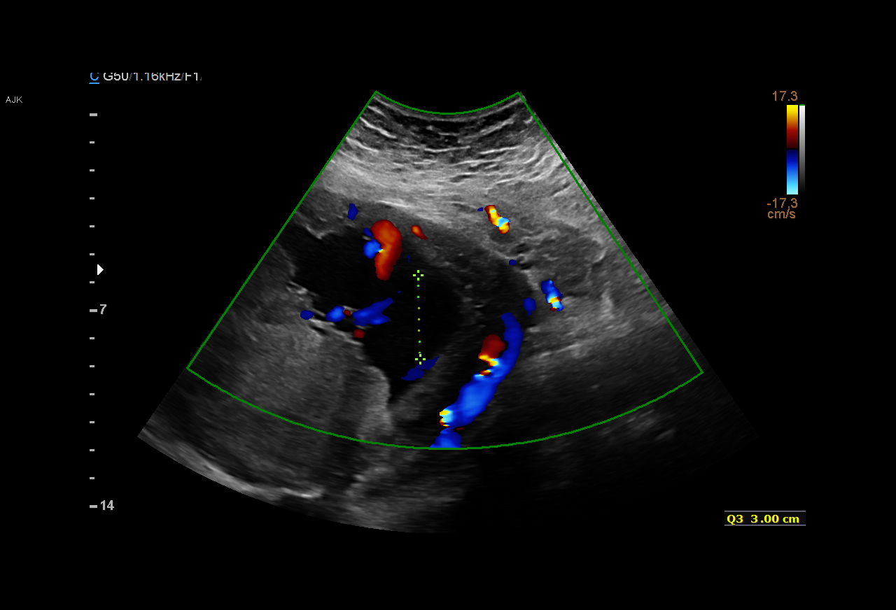

[12 of 28 positions shown; findings below may reference images not displayed]

DIEDERIKS

1  SASSELINE CATALIN          [id number]      [id number]     [id number]
Indications

31 weeks gestation of pregnancy
History of cesarean delivery, currently
pregnant
Poor obstetric history: Previous preterm
delivery, antepartum
Non-reactive NST
Obesity complicating pregnancy, third
trimester
Hypertension - Chronic/Pre-existing
Pregnancy complicated by previous gastric
bypass, antepartum, third trimester
Advanced maternal age multigravida 35+,
third trimester
OB History

Gravidity:    5         Term:   3        Prem:   1        SAB:   0
TOP:          0       Ectopic:  0        Living: 4
Fetal Evaluation

Num Of Fetuses:     1
Fetal Heart         143
Rate(bpm):
Cardiac Activity:   Observed
Presentation:       Cephalic
Placenta:           Posterior Fundal, above cervical os
P. Cord Insertion:  Visualized, central

Amniotic Fluid
AFI FV:      Subjectively within normal limits

AFI Sum(cm)     %Tile       Largest Pocket(cm)
14.2            48
RUQ(cm)       RLQ(cm)       LUQ(cm)        LLQ(cm)
3.15          2.45          5.6            3
Biophysical Evaluation

Amniotic F.V:   Within normal limits       F. Tone:        Observed
F. Movement:    Observed                   Score:          [DATE]
F. Breathing:   Observed
Gestational Age

LMP:           34w 6d        Date:  03/28/16                 EDD:   01/02/17
Clinical EDD:  31w 2d                                        EDD:   01/27/17
Best:          31w 2d     Det. By:  Clinical EDD             EDD:   01/27/17
Cervix Uterus Adnexa

Cervix
Not visualized (advanced GA >09wks)

Uterus
No abnormality visualized.

Left Ovary
Within normal limits.

Right Ovary
Within normal limits.

Adnexa:       No abnormality visualized.
Impression

Single living intrauterine pregnancy at 39w9d.
Normal amniotic fluid volume.
BPP [DATE].
Recommendations

Continue antenatal testing and serial growth ultrasounds.
Delivery by 39 weeks.

## 2018-06-07 ENCOUNTER — Encounter: Payer: Self-pay | Admitting: Emergency Medicine

## 2018-06-07 ENCOUNTER — Emergency Department: Payer: Self-pay

## 2018-06-07 ENCOUNTER — Emergency Department
Admission: EM | Admit: 2018-06-07 | Discharge: 2018-06-07 | Disposition: A | Discharge status: Home / Self Care | Payer: Self-pay | Attending: Emergency Medicine | Admitting: Emergency Medicine

## 2018-06-07 DIAGNOSIS — K5732 Diverticulitis of large intestine without perforation or abscess without bleeding: Secondary | ICD-10-CM | POA: Insufficient documentation

## 2018-06-07 DIAGNOSIS — I10 Essential (primary) hypertension: Secondary | ICD-10-CM | POA: Insufficient documentation

## 2018-06-07 DIAGNOSIS — R1030 Lower abdominal pain, unspecified: Secondary | ICD-10-CM | POA: Insufficient documentation

## 2018-06-07 LAB — BASIC METABOLIC PANEL
Anion gap: 8 (ref 5–15)
BUN: 10 mg/dL (ref 6–20)
CALCIUM: 8.8 mg/dL — AB (ref 8.9–10.3)
CO2: 28 mmol/L (ref 22–32)
CREATININE: 1.14 mg/dL — AB (ref 0.44–1.00)
Chloride: 102 mmol/L (ref 98–111)
GFR calc Af Amer: >60 mL/min (ref >60)
GLUCOSE: 88 mg/dL (ref 70–99)
Potassium: 3.5 mmol/L (ref 3.5–5.1)
Sodium: 138 mmol/L (ref 135–145)

## 2018-06-07 LAB — HEPATIC FUNCTION PANEL
ALK PHOS: 53 U/L (ref 38–126)
ALT: 13 U/L (ref 0–44)
AST: 14 U/L — AB (ref 15–41)
Albumin: 4.1 g/dL (ref 3.5–5.0)
BILIRUBIN INDIRECT: 1 mg/dL — AB (ref 0.3–0.9)
Bilirubin, Direct: 0.2 mg/dL (ref 0.0–0.2)
TOTAL PROTEIN: 7.3 g/dL (ref 6.5–8.1)
Total Bilirubin: 1.2 mg/dL (ref 0.3–1.2)

## 2018-06-07 LAB — URINALYSIS, COMPLETE (UACMP) WITH MICROSCOPIC
BACTERIA UA: NONE SEEN
BILIRUBIN URINE: NEGATIVE
Glucose, UA: NEGATIVE mg/dL
Ketones, ur: NEGATIVE mg/dL
Leukocytes, UA: NEGATIVE
NITRITE: NEGATIVE
PH: 7 (ref 5.0–8.0)
Protein, ur: NEGATIVE mg/dL
SPECIFIC GRAVITY, URINE: 1.003 — AB (ref 1.005–1.030)

## 2018-06-07 LAB — LIPASE, BLOOD: Lipase: 23 U/L (ref 11–51)

## 2018-06-07 LAB — CBC
HCT: 35.8 % (ref 35.0–47.0)
Hemoglobin: 12.9 g/dL (ref 12.0–16.0)
MCH: 31.8 pg (ref 26.0–34.0)
MCHC: 35.9 g/dL (ref 32.0–36.0)
MCV: 88.6 fL (ref 80.0–100.0)
Platelets: 250 10*3/uL (ref 150–440)
RBC: 4.04 MIL/uL (ref 3.80–5.20)
RDW: 12.5 % (ref 11.5–14.5)
WBC: 6.6 10*3/uL (ref 3.6–11.0)

## 2018-06-07 MED ORDER — KETOROLAC TROMETHAMINE 30 MG/ML IJ SOLN
15.0000 mg | Freq: Once | INTRAMUSCULAR | Status: AC
  Administered 2018-06-07: 15 mg via INTRAVENOUS

## 2018-06-07 MED ORDER — AMOXICILLIN-POT CLAVULANATE 875-125 MG PO TABS: 1.0000 | ORAL_TABLET | Freq: Two times a day (BID) | ORAL | 0 refills | Status: AC

## 2018-06-07 MED ORDER — ONDANSETRON HCL 4 MG/2ML IJ SOLN
4.0000 mg | Freq: Once | INTRAMUSCULAR | Status: AC
  Administered 2018-06-07: 4 mg via INTRAVENOUS

## 2018-06-07 MED ORDER — MORPHINE SULFATE (PF) 4 MG/ML IV SOLN
INTRAVENOUS | Status: AC
  Filled 2018-06-07: qty 1

## 2018-06-07 MED ORDER — HYDROCODONE-ACETAMINOPHEN 5-325 MG PO TABS: 1.0000 | ORAL_TABLET | Freq: Four times a day (QID) | ORAL | 0 refills | Status: DC | PRN

## 2018-06-07 MED ORDER — AMOXICILLIN-POT CLAVULANATE 875-125 MG PO TABS
1.0000 | ORAL_TABLET | Freq: Once | ORAL | Status: AC
  Administered 2018-06-07: 1 via ORAL
  Filled 2018-06-07: qty 1

## 2018-06-07 MED ORDER — MORPHINE SULFATE (PF) 4 MG/ML IV SOLN
4.0000 mg | Freq: Once | INTRAVENOUS | Status: AC
  Administered 2018-06-07: 4 mg via INTRAVENOUS

## 2018-06-07 MED ORDER — ONDANSETRON HCL 4 MG/2ML IJ SOLN
INTRAMUSCULAR | Status: AC
  Filled 2018-06-07: qty 2

## 2018-06-07 MED ORDER — KETOROLAC TROMETHAMINE 30 MG/ML IJ SOLN
INTRAMUSCULAR | Status: AC
  Filled 2018-06-07: qty 1

## 2018-06-07 NOTE — ED Triage Notes (Addendum)
Patient with complaint of lower abdominal pain times 8 hours. Patient states that she also had some dizziness and felt like she was going to pass out. Patient states that she has also had nausea. Patient states that she has a history of diverticulitis.

## 2018-06-07 NOTE — ED Notes (Signed)
Patient transported to CT 

## 2018-06-07 NOTE — ED Notes (Signed)

## 2018-06-07 NOTE — ED Notes (Signed)
Patient c/o lower abdominal pain beginning at 1800 yesterday. Patient reports nausea, denies diarrhea. Patient reports syncopal episode when trying to have a BM this evening. Patient reports hx of diverticulitis 3 years ago - reports this feels similar.

## 2018-06-07 NOTE — Discharge Instructions (Signed)
It was a pleasure to take care of you today, and thank you for coming to our emergency department.  If you have any questions or concerns before leaving please ask the nurse to grab me and I'm more than happy to go through your aftercare instructions again.  If you were prescribed any opioid pain medication today such as Norco, Vicodin, Percocet, morphine, hydrocodone, or oxycodone please make sure you do not drive when you are taking this medication as it can alter your ability to drive safely.  If you have any concerns once you are home that you are not improving or are in fact getting worse before you can make it to your follow-up appointment, please do not hesitate to call 911 and come back for further evaluation.  Merrily Brittle, MD  Results for orders placed or performed during the hospital encounter of 06/07/18  Basic metabolic panel  Result Value Ref Range   Sodium 138 135 - 145 mmol/L   Potassium 3.5 3.5 - 5.1 mmol/L   Chloride 102 98 - 111 mmol/L   CO2 28 22 - 32 mmol/L   Glucose, Bld 88 70 - 99 mg/dL   BUN 10 6 - 20 mg/dL   Creatinine, Ser 1.61 (H) 0.44 - 1.00 mg/dL   Calcium 8.8 (L) 8.9 - 10.3 mg/dL   GFR calc non Af Amer >60 >60 mL/min   GFR calc Af Amer >60 >60 mL/min   Anion gap 8 5 - 15  CBC  Result Value Ref Range   WBC 6.6 3.6 - 11.0 K/uL   RBC 4.04 3.80 - 5.20 MIL/uL   Hemoglobin 12.9 12.0 - 16.0 g/dL   HCT 09.6 04.5 - 40.9 %   MCV 88.6 80.0 - 100.0 fL   MCH 31.8 26.0 - 34.0 pg   MCHC 35.9 32.0 - 36.0 g/dL   RDW 81.1 91.4 - 78.2 %   Platelets 250 150 - 440 K/uL  Urinalysis, Complete w Microscopic  Result Value Ref Range   Color, Urine STRAW (A) YELLOW   APPearance CLEAR (A) CLEAR   Specific Gravity, Urine 1.003 (L) 1.005 - 1.030   pH 7.0 5.0 - 8.0   Glucose, UA NEGATIVE NEGATIVE mg/dL   Hgb urine dipstick SMALL (A) NEGATIVE   Bilirubin Urine NEGATIVE NEGATIVE   Ketones, ur NEGATIVE NEGATIVE mg/dL   Protein, ur NEGATIVE NEGATIVE mg/dL   Nitrite NEGATIVE  NEGATIVE   Leukocytes, UA NEGATIVE NEGATIVE   RBC / HPF 0-5 0 - 5 RBC/hpf   WBC, UA 0-5 0 - 5 WBC/hpf   Bacteria, UA NONE SEEN NONE SEEN   Squamous Epithelial / LPF 0-5 0 - 5  Lipase, blood  Result Value Ref Range   Lipase 23 11 - 51 U/L  Hepatic function panel  Result Value Ref Range   Total Protein 7.3 6.5 - 8.1 g/dL   Albumin 4.1 3.5 - 5.0 g/dL   AST 14 (L) 15 - 41 U/L   ALT 13 0 - 44 U/L   Alkaline Phosphatase 53 38 - 126 U/L   Total Bilirubin 1.2 0.3 - 1.2 mg/dL   Bilirubin, Direct 0.2 0.0 - 0.2 mg/dL   Indirect Bilirubin 1.0 (H) 0.3 - 0.9 mg/dL   Ct Abdomen Pelvis Wo Contrast  Result Date: 06/07/2018 CLINICAL DATA:  37 year old female with abdominal pain. Concern for acute diverticulitis. EXAM: CT ABDOMEN AND PELVIS WITHOUT CONTRAST TECHNIQUE: Multidetector CT imaging of the abdomen and pelvis was performed following the standard protocol without IV contrast.  COMPARISON:  CT of the abdomen pelvis dated 04/08/2016 FINDINGS: Evaluation of this exam is limited in the absence of intravenous contrast. Lower chest: The visualized lung bases are clear. No intra-abdominal free air. There is a small free fluid within pelvis. Hepatobiliary: The liver is unremarkable. There are multiple gallstones. No CT evidence of acute cholecystitis. Pancreas: Unremarkable. No pancreatic ductal dilatation or surrounding inflammatory changes. Spleen: Normal in size without focal abnormality. Adrenals/Urinary Tract: Adrenal glands are unremarkable. Kidneys are normal, without renal calculi, focal lesion, or hydronephrosis. Bladder is unremarkable. Stomach/Bowel: Postsurgical changes of partial gastrectomy. There is sigmoid diverticulosis with active inflammatory changes. No diverticular abscess or perforation at this time. There is no bowel obstruction. Normal appendix. Vascular/Lymphatic: The abdominal aorta and IVC are grossly unremarkable on this noncontrast CT. No portal venous gas. There is no adenopathy.  Reproductive: The uterus is retroflexed. The ovaries are grossly unremarkable as visualized. There is a 2 cm dominant right ovarian follicle. Other: Small fat containing umbilical hernia. No fluid collection or inflammatory changes. Musculoskeletal: No acute or significant osseous findings. IMPRESSION: Acute sigmoid diverticulitis. No diverticular abscess or perforation at this time. Electronically Signed   By: Elgie CollardArash  Radparvar M.D.   On: 06/07/2018 04:51

## 2018-06-07 NOTE — ED Provider Notes (Signed)
[facility] Emergency Department Provider Note  ____________________________________________   First MD Initiated Contact with Patient 06/07/18 0309     (approximate)  I have reviewed the triage vital signs and the nursing notes.   HISTORY  Chief Complaint Abdominal Pain   HPI Michelle Holder is a 37 y.o. female self presents to the emergency department with roughly 24 hours of lower abdominal pain.  Her pain is mild to moderate cramping worse in the left lower quadrant mostly constant.  No diarrhea.  Some nausea and some constipation.  She does have a history of diverticulitis once in the past.  She has had one cesarean section and a gastric sleeve about 8 years ago.  She has not been vomiting.  No fevers or chills.  This feels similar to her episode of diverticulitis about 3 years ago which prompted the visit.  Earlier today she was sitting on the toilet straining to defecate when she had a syncopal episode.  She felt hot warm and nauseated shortly before.  No antecedent palpitations or chest pain.  No family history of sudden cardiac death.    Past Medical History:  Diagnosis Date  . AMA (advanced maternal age) multigravida 35+   . Bariatric surgery status   . Dysmenorrhea   . Hypertension   . Knee pain    right  . Obesity   . S/P cesarean section 12/03/2016  . Thyroid disease    thyroid nodule  . Thyroid nodule   . Vitamin D deficiency     Patient Active Problem List   Diagnosis Date Noted  . S/P cesarean section 12/03/2016  . Pregnancy induced hypertension, third trimester 11/27/2016  . Hypertension affecting pregnancy in third trimester 11/23/2016  . Chronic hypertension with superimposed pre-eclampsia 11/20/2016  . Pre-existing essential htn comp pregnancy, third trimester 11/20/2016  . Diverticulitis of intestine with abscess 04/08/2016  . Diverticulitis of large intestine with abscess without bleeding   . Dysmenorrhea 07/07/2015   . History of iron deficiency 07/07/2015  . Thyroid nodule 07/07/2015  . Vitamin D deficiency 07/07/2015  . Adult BMI 30+ 03/23/2010  . Benign essential HTN 02/20/2010    Past Surgical History:  Procedure Laterality Date  . CESAREAN SECTION    . CESAREAN SECTION N/A 12/03/2016   Procedure: CESAREAN SECTION;  Surgeon: Sherian Rein, MD;  Location: [facility];  Service: Obstetrics;  Laterality: N/A;  . LAPAROSCOPIC GASTRECTOMY     SILS    Prior to Admission medications   Medication Sig Start Date End Date Taking? Authorizing Provider  amoxicillin-clavulanate (AUGMENTIN) 875-125 MG tablet Take 1 tablet by mouth 2 (two) times daily for 14 days. 06/07/18 06/21/18  Merrily Brittle, MD  cyclobenzaprine (FLEXERIL) 5 MG tablet Take 1 tablet (5 mg total) by mouth 3 (three) times daily as needed for muscle spasms. 10/11/17   Alba Cory, MD  HYDROcodone-acetaminophen (NORCO) 5-325 MG tablet Take 1 tablet by mouth every 6 (six) hours as needed for up to 7 doses for severe pain. 06/07/18   Merrily Brittle, MD  ibuprofen (ADVIL,MOTRIN) 800 MG tablet Take 1 tablet (800 mg total) every 8 (eight) hours by mouth. 10/07/17   Doren Custard, FNP  labetalol (NORMODYNE) 100 MG tablet Take 1 tablet (100 mg total) by mouth 2 (two) times daily. Patient not taking: Reported on 10/07/2017 12/06/16   Bovard-Stuckert, Augusto Gamble, MD  Prenatal Vit-Fe Fumarate-FA (PRENATAL MULTIVITAMIN) TABS tablet Take 1 tablet by mouth daily at 12 noon. 12/06/16  Bovard-Stuckert, Jody, MD  Vitamin D, Ergocalciferol, (DRISDOL) 50000 units CAPS capsule Take 1 capsule (50,000 Units total) by mouth every 7 (seven) days. 10/17/17   Doren Custard, FNP    Allergies Patient has no known allergies.  Family History  Problem Relation Age of Onset  . Down syndrome Brother     Social History Social History   Tobacco Use  . Smoking status: Never Smoker  . Smokeless tobacco: Never Used  Substance Use Topics  . Alcohol use:  No    Alcohol/week: 0.0 oz  . Drug use: No    Review of Systems Constitutional: No fever/chills Eyes: No visual changes. ENT: No sore throat. Cardiovascular: Denies chest pain. Respiratory: Denies shortness of breath. Gastrointestinal: Positive for abdominal pain.  Positive for nausea, no vomiting.  No diarrhea.  Positive for constipation. Genitourinary: Negative for dysuria. Musculoskeletal: Negative for back pain. Skin: Negative for rash. Neurological: Negative for headaches, focal weakness or numbness.   ____________________________________________   PHYSICAL EXAM:  VITAL SIGNS: ED Triage Vitals [06/07/18 0246]  Enc Vitals Group     BP (!) 164/116     Pulse Rate 93     Resp 18     Temp 97.8 F (36.6 C)     Temp Source Oral     SpO2 100 %     Weight 221 lb (100.2 kg)     Height 5\' 9"  (1.753 m)     Head Circumference      Peak Flow      Pain Score 8     Pain Loc      Pain Edu?      Excl. in GC?     Constitutional: Alert and oriented x4 pleasant cooperative somewhat uncomfortable appearing Eyes: PERRL EOMI. Head: Atraumatic. Nose: No congestion/rhinnorhea. Mouth/Throat: No trismus Neck: No stridor.   Cardiovascular: Normal rate, regular rhythm. Grossly normal heart sounds.  Good peripheral circulation. Respiratory: Normal respiratory effort.  No retractions. Lungs CTAB and moving good air Gastrointestinal: Soft abdomen she is tender in her left lower quadrant with negative Rovsing's no rebound or guarding no peritonitis no McBurney's tenderness Musculoskeletal: No lower extremity edema   Neurologic:  Normal speech and language. No gross focal neurologic deficits are appreciated. Skin:  Skin is warm, dry and intact. No rash noted. Psychiatric: Mood and affect are normal. Speech and behavior are normal.    ____________________________________________   DIFFERENTIAL includes but not limited to  Diverticulitis, appendicitis, ectopic pregnancy, urinary tract  infection ____________________________________________   LABS (all labs ordered are listed, but only abnormal results are displayed)  Labs Reviewed  BASIC METABOLIC PANEL - Abnormal; Notable for the following components:      Result Value   Creatinine, Ser 1.14 (*)    Calcium 8.8 (*)    All other components within normal limits  URINALYSIS, COMPLETE (UACMP) WITH MICROSCOPIC - Abnormal; Notable for the following components:   Color, Urine STRAW (*)    APPearance CLEAR (*)    Specific Gravity, Urine 1.003 (*)    Hgb urine dipstick SMALL (*)    All other components within normal limits  HEPATIC FUNCTION PANEL - Abnormal; Notable for the following components:   AST 14 (*)    Indirect Bilirubin 1.0 (*)    All other components within normal limits  CBC  LIPASE, BLOOD    Lab work reviewed by me with no acute disease __________________________________________  EKG  ED ECG REPORT I, Merrily Brittle, the attending physician, personally viewed and  interpreted this ECG.  Date: 06/08/2018 EKG Time:  Rate: 83 Rhythm: normal sinus rhythm QRS Axis: normal Intervals: normal ST/T Wave abnormalities: normal Narrative Interpretation: no evidence of acute ischemia  ____________________________________________  RADIOLOGY  CT abdomen pelvis reviewed by me consistent with uncomplicated sigmoid diverticulitis ____________________________________________   PROCEDURES  Procedure(s) performed: yes  Angiocath insertion Performed by: Merrily BrittleNeil Breshae Belcher  Consent: Verbal consent obtained. Risks and benefits: risks, benefits and alternatives were discussed Time out: Immediately prior to procedure a "time out" was called to verify the correct patient, procedure, equipment, support staff and site/side marked as required.  Preparation: Patient was prepped and draped in the usual sterile fashion.  Vein Location: Right antecubital fossa  Gauge: 20  Normal blood return and flush without  difficulty Patient tolerance: Patient tolerated the procedure well with no immediate complications.     Procedures  Critical Care performed: no  ____________________________________________   INITIAL IMPRESSION / ASSESSMENT AND PLAN / ED COURSE  Pertinent labs & imaging results that were available during my care of the patient were reviewed by me and considered in my medical decision making (see chart for details).   The patient arrives uncomfortable appearing with left lower quadrant pain, tenderness, and constipation which could be consistent with recurrent diverticulitis.  The patient did not drive herself here so I started an IV in her right antecubital fossa and we will check labs along with 4 mg of IV morphine and 4 mg of IV ondansetron.  She does require CT scan with IV contrast.  The patient CT scan shows uncomplicated sigmoid diverticulitis.  Given a first dose of Augmentin here and I will prescribe her 2 weeks as well as refer her to general surgery to discuss future options.  Strict return precautions have been given and the patient verbalizes understanding and agreement with the plan.  She is familiar with a diverticulitis diet.      ____________________________________________   FINAL CLINICAL IMPRESSION(S) / ED DIAGNOSES  Final diagnoses:  Sigmoid diverticulitis      NEW MEDICATIONS STARTED DURING THIS VISIT:  Discharge Medication List as of 06/07/2018  5:07 AM    START taking these medications   Details  amoxicillin-clavulanate (AUGMENTIN) 875-125 MG tablet Take 1 tablet by mouth 2 (two) times daily for 14 days., Starting Sat 06/07/2018, Until Sat 06/21/2018, Print    HYDROcodone-acetaminophen (NORCO) 5-325 MG tablet Take 1 tablet by mouth every 6 (six) hours as needed for up to 7 doses for severe pain., Starting Sat 06/07/2018, Print         Note:  This document was prepared using Dragon voice recognition software and may include unintentional dictation  errors.     Merrily Brittleifenbark, Grisela Mesch, MD 06/08/18 2219

## 2018-06-07 NOTE — ED Notes (Signed)
POC urine pregnancy negative. 

## 2018-12-22 ENCOUNTER — Emergency Department
Admission: EM | Admit: 2018-12-22 | Discharge: 2018-12-22 | Disposition: A | Discharge status: Home / Self Care | Payer: Self-pay | Attending: Emergency Medicine | Admitting: Emergency Medicine

## 2018-12-22 ENCOUNTER — Other Ambulatory Visit: Payer: Self-pay

## 2018-12-22 ENCOUNTER — Encounter: Payer: Self-pay | Admitting: Emergency Medicine

## 2018-12-22 DIAGNOSIS — I10 Essential (primary) hypertension: Secondary | ICD-10-CM | POA: Insufficient documentation

## 2018-12-22 DIAGNOSIS — Z79899 Other long term (current) drug therapy: Secondary | ICD-10-CM | POA: Insufficient documentation

## 2018-12-22 DIAGNOSIS — K5792 Diverticulitis of intestine, part unspecified, without perforation or abscess without bleeding: Secondary | ICD-10-CM | POA: Insufficient documentation

## 2018-12-22 DIAGNOSIS — E860 Dehydration: Secondary | ICD-10-CM | POA: Insufficient documentation

## 2018-12-22 LAB — CBC
HCT: 42.4 % (ref 36.0–46.0)
Hemoglobin: 13.9 g/dL (ref 12.0–15.0)
MCH: 28.8 pg (ref 26.0–34.0)
MCHC: 32.8 g/dL (ref 30.0–36.0)
MCV: 87.8 fL (ref 80.0–100.0)
Platelets: 250 10*3/uL (ref 150–400)
RBC: 4.83 MIL/uL (ref 3.87–5.11)
RDW: 12.6 % (ref 11.5–15.5)
WBC: 6.9 10*3/uL (ref 4.0–10.5)
nRBC: 0 % (ref 0.0–0.2)

## 2018-12-22 LAB — URINALYSIS, COMPLETE (UACMP) WITH MICROSCOPIC
BACTERIA UA: NONE SEEN
Bilirubin Urine: NEGATIVE
Glucose, UA: NEGATIVE mg/dL
Ketones, ur: NEGATIVE mg/dL
Leukocytes, UA: NEGATIVE
NITRITE: NEGATIVE
PH: 7 (ref 5.0–8.0)
Protein, ur: 30 mg/dL — AB
SPECIFIC GRAVITY, URINE: 1.003 — AB (ref 1.005–1.030)

## 2018-12-22 LAB — COMPREHENSIVE METABOLIC PANEL
ALT: 14 U/L (ref 0–44)
AST: 17 U/L (ref 15–41)
Albumin: 4.2 g/dL (ref 3.5–5.0)
Alkaline Phosphatase: 44 U/L (ref 38–126)
Anion gap: 8 (ref 5–15)
BILIRUBIN TOTAL: 0.7 mg/dL (ref 0.3–1.2)
BUN: 9 mg/dL (ref 6–20)
CO2: 28 mmol/L (ref 22–32)
CREATININE: 1.24 mg/dL — AB (ref 0.44–1.00)
Calcium: 9 mg/dL (ref 8.9–10.3)
Chloride: 98 mmol/L (ref 98–111)
GFR calc Af Amer: >60 mL/min (ref >60)
GFR, EST NON AFRICAN AMERICAN: 55 mL/min — AB (ref >60)
Glucose, Bld: 116 mg/dL — ABNORMAL HIGH (ref 70–99)
Potassium: 3.1 mmol/L — ABNORMAL LOW (ref 3.5–5.1)
Sodium: 134 mmol/L — ABNORMAL LOW (ref 135–145)
TOTAL PROTEIN: 7.7 g/dL (ref 6.5–8.1)

## 2018-12-22 LAB — POCT PREGNANCY, URINE: Preg Test, Ur: NEGATIVE

## 2018-12-22 LAB — LIPASE, BLOOD: Lipase: 24 U/L (ref 11–51)

## 2018-12-22 MED ORDER — FAMOTIDINE 20 MG PO TABS: 20.0000 mg | ORAL_TABLET | Freq: Two times a day (BID) | ORAL | 0 refills | Status: AC

## 2018-12-22 MED ORDER — CIPROFLOXACIN HCL 500 MG PO TABS: 500.0000 mg | ORAL_TABLET | Freq: Two times a day (BID) | ORAL | 0 refills | Status: DC

## 2018-12-22 MED ORDER — KETOROLAC TROMETHAMINE 30 MG/ML IJ SOLN
15.0000 mg | INTRAMUSCULAR | Status: AC
  Administered 2018-12-22: 15 mg via INTRAVENOUS
  Filled 2018-12-22: qty 1

## 2018-12-22 MED ORDER — ONDANSETRON 4 MG PO TBDP: 4.0000 mg | ORAL_TABLET | Freq: Three times a day (TID) | ORAL | 0 refills | Status: AC | PRN

## 2018-12-22 MED ORDER — SODIUM CHLORIDE 0.9% FLUSH: 3.0000 mL | Freq: Once | INTRAVENOUS | Status: DC

## 2018-12-22 MED ORDER — SODIUM CHLORIDE 0.9 % IV BOLUS
1000.0000 mL | Freq: Once | INTRAVENOUS | Status: AC
  Administered 2018-12-22: 1000 mL via INTRAVENOUS

## 2018-12-22 MED ORDER — NAPROXEN 500 MG PO TABS: 500.0000 mg | ORAL_TABLET | Freq: Two times a day (BID) | ORAL | 0 refills | Status: DC

## 2018-12-22 MED ORDER — ONDANSETRON 4 MG PO TBDP: 4.0000 mg | ORAL_TABLET | Freq: Three times a day (TID) | ORAL | 0 refills | Status: DC | PRN

## 2018-12-22 MED ORDER — ONDANSETRON HCL 4 MG/2ML IJ SOLN
4.0000 mg | Freq: Once | INTRAMUSCULAR | Status: AC
  Administered 2018-12-22: 4 mg via INTRAVENOUS
  Filled 2018-12-22: qty 2

## 2018-12-22 MED ORDER — OXYCODONE-ACETAMINOPHEN 5-325 MG PO TABS: 1.0000 | ORAL_TABLET | Freq: Four times a day (QID) | ORAL | 0 refills | Status: AC | PRN

## 2018-12-22 MED ORDER — FAMOTIDINE 20 MG PO TABS
40.0000 mg | ORAL_TABLET | Freq: Once | ORAL | Status: AC
  Administered 2018-12-22: 40 mg via ORAL
  Filled 2018-12-22: qty 2

## 2018-12-22 MED ORDER — FAMOTIDINE 20 MG PO TABS: 20.0000 mg | ORAL_TABLET | Freq: Two times a day (BID) | ORAL | 0 refills | Status: DC

## 2018-12-22 MED ORDER — METRONIDAZOLE 500 MG PO TABS: 500.0000 mg | ORAL_TABLET | Freq: Two times a day (BID) | ORAL | 0 refills | Status: DC

## 2018-12-22 NOTE — ED Notes (Signed)
First Nurse Note: Patient to Rm 14, Michelle Dike RN aware.

## 2018-12-22 NOTE — ED Triage Notes (Addendum)
States had flu last week.  Stomach started to hurt Friday and has continued over the weekend.  Patient c/o nausea and dizziness.  Patient is AAOx3.  Skin warm and dry. NAD.  MAE equally and strong.  Transferred from chair to wheelchair independently and without difficulty.  Patient is very dramatic in triage.  Groaning.  Speaking softly.

## 2018-12-22 NOTE — ED Notes (Signed)
Pt was discharged but pulls call bell in room.  She states that she feels that she was not heard about her diverticulitis.  Explained that the antibiotics will address this but pt is not satisfied and also would like a "non NSAID pain med".  Will notify EDP to speak with pt.

## 2018-12-22 NOTE — Discharge Instructions (Addendum)
Your lab test today were okay.  Continue taking nausea medicine and antacid medicine to control your symptoms so that you can stay well-hydrated while this illness runs its course.

## 2018-12-22 NOTE — ED Provider Notes (Addendum)
[facility] Emergency Department Provider Note  ____________________________________________  Time seen: Approximately 2:12 PM  I have reviewed the triage vital signs and the nursing notes.   HISTORY  Chief Complaint Abdominal Pain    HPI Michelle Holder is a 38 y.o. female with a history of hypertension and obesity who complains of generalized abdominal pain with nausea and decreased appetite for the past 2 to 3 days.  A week ago she had the flu and as the respiratory symptoms seem to be clearing up now she is having this gastrointestinal illness.  Denies any black or bloody stool, denies diarrhea.  No significant vomiting.  She reports only being able to eat 1 or 2 small meals a day over the past few days and not drinking much water.  She gets dizzy when she stands up but feels better when she lies back down.  Denies any headache or vision change, no paresthesias or weakness.  No recent trauma.   Feels like prior episodes of diverticulitis.   Past Medical History:  Diagnosis Date  . AMA (advanced maternal age) multigravida 35+   . Bariatric surgery status   . Dysmenorrhea   . Hypertension   . Knee pain    right  . Obesity   . S/P cesarean section 12/03/2016  . Thyroid disease    thyroid nodule  . Thyroid nodule   . Vitamin D deficiency      Patient Active Problem List   Diagnosis Date Noted  . S/P cesarean section 12/03/2016  . Pregnancy induced hypertension, third trimester 11/27/2016  . Hypertension affecting pregnancy in third trimester 11/23/2016  . Chronic hypertension with superimposed pre-eclampsia 11/20/2016  . Pre-existing essential htn comp pregnancy, third trimester 11/20/2016  . Diverticulitis of intestine with abscess 04/08/2016  . Diverticulitis of large intestine with abscess without bleeding   . Dysmenorrhea 07/07/2015  . History of iron deficiency 07/07/2015  . Thyroid nodule 07/07/2015  . Vitamin D deficiency  07/07/2015  . Adult BMI 30+ 03/23/2010  . Benign essential HTN 02/20/2010     Past Surgical History:  Procedure Laterality Date  . CESAREAN SECTION    . CESAREAN SECTION N/A 12/03/2016   Procedure: CESAREAN SECTION;  Surgeon: Sherian ReinJody Bovard-Stuckert, MD;  Location: [facility];  Service: Obstetrics;  Laterality: N/A;  . LAPAROSCOPIC GASTRECTOMY     SILS     Prior to Admission medications   Medication Sig Start Date End Date Taking? Authorizing Provider  Multiple Vitamin (MULTIVITAMIN) tablet Take 1 tablet by mouth daily.   Yes [provider]  famotidine (PEPCID) 20 MG tablet Take 1 tablet (20 mg total) by mouth 2 (two) times daily. 12/22/18   Sharman CheekStafford, Demarrio Menges, MD  naproxen (NAPROSYN) 500 MG tablet Take 1 tablet (500 mg total) by mouth 2 (two) times daily with a meal. 12/22/18   Sharman CheekStafford, Cylee Dattilo, MD  ondansetron (ZOFRAN ODT) 4 MG disintegrating tablet Take 1 tablet (4 mg total) by mouth every 8 (eight) hours as needed for nausea or vomiting. 12/22/18   Sharman CheekStafford, Cambri Plourde, MD     Allergies Patient has no known allergies.   Family History  Problem Relation Age of Onset  . Down syndrome Brother     Social History Social History   Tobacco Use  . Smoking status: Never Smoker  . Smokeless tobacco: Never Used  Substance Use Topics  . Alcohol use: No    Alcohol/week: 0.0 standard drinks  . Drug use: No    Review of  Systems  Constitutional:   No fever or chills.  ENT:   No sore throat. No rhinorrhea. Cardiovascular:   No chest pain or syncope. Respiratory:   No dyspnea or cough. Gastrointestinal:   Positive as above for abdominal pain and nausea without vomiting and diarrhea.  Musculoskeletal:   Negative for focal pain or swelling All other systems reviewed and are negative except as documented above in ROS and HPI.  ____________________________________________   PHYSICAL EXAM:  VITAL SIGNS: ED Triage Vitals [12/22/18 0907]  Enc Vitals Group     BP 115/81      Pulse Rate 86     Resp 16     Temp 98.3 F (36.8 C)     Temp Source Oral     SpO2 100 %     Weight 200 lb (90.7 kg)     Height 5\' 8"  (1.727 m)     Head Circumference      Peak Flow      Pain Score 8     Pain Loc      Pain Edu?      Excl. in GC?     Vital signs reviewed, nursing assessments reviewed.   Constitutional:   Alert and oriented. Non-toxic appearance. Eyes:   Conjunctivae are normal. EOMI. PERRL. ENT      Head:   Normocephalic and atraumatic.      Nose:   No congestion/rhinnorhea.       Mouth/Throat:   Dry mucous membranes, no pharyngeal erythema. No peritonsillar mass.       Neck:   No meningismus. Full ROM. Hematological/Lymphatic/Immunilogical:   No cervical lymphadenopathy. Cardiovascular:   RRR. Symmetric bilateral radial and DP pulses.  No murmurs. Cap refill less than 2 seconds. Respiratory:   Normal respiratory effort without tachypnea/retractions. Breath sounds are clear and equal bilaterally. No wheezes/rales/rhonchi. Gastrointestinal:   Soft with diffuse left-sided tenderness. Non distended. There is no CVA tenderness.  No rebound, rigidity, or guarding. Musculoskeletal:   Normal range of motion in all extremities. No joint effusions.  No lower extremity tenderness.  No edema. Neurologic:   Normal speech and language.  Motor grossly intact. No acute focal neurologic deficits are appreciated.  Skin:    Skin is warm, dry and intact. No rash noted.  No petechiae, purpura, or bullae.  ____________________________________________    LABS (pertinent positives/negatives) (all labs ordered are listed, but only abnormal results are displayed) Labs Reviewed  COMPREHENSIVE METABOLIC PANEL - Abnormal; Notable for the following components:      Result Value   Sodium 134 (*)    Potassium 3.1 (*)    Glucose, Bld 116 (*)    Creatinine, Ser 1.24 (*)    GFR calc non Af Amer 55 (*)    All other components within normal limits  URINALYSIS, COMPLETE (UACMP) WITH  MICROSCOPIC - Abnormal; Notable for the following components:   Color, Urine YELLOW (*)    APPearance CLEAR (*)    Specific Gravity, Urine 1.003 (*)    Hgb urine dipstick SMALL (*)    Protein, ur 30 (*)    All other components within normal limits  LIPASE, BLOOD  CBC  POC URINE PREG, ED  POCT PREGNANCY, URINE   ____________________________________________   EKG    ____________________________________________    RADIOLOGY  No results found.  ____________________________________________   PROCEDURES Procedures  ____________________________________________    CLINICAL IMPRESSION / ASSESSMENT AND PLAN / ED COURSE  Pertinent labs & imaging results that were available during  my care of the patient were reviewed by me and considered in my medical decision making (see chart for details).    Patient presents with abdominal pain and nausea.  Vital signs are normal, exam is benign and reassuring.Considering the patient's symptoms, medical history, and physical examination today, I have low suspicion for cholecystitis or biliary pathology, pancreatitis, perforation or bowel obstruction, hernia, intra-abdominal abscess, AAA or dissection, volvulus or intussusception, mesenteric ischemia, or appendicitis.  Clinically she appears dehydrated from a viral syndrome versus diverticulitis flare.  We will give her an acids and Zofran and IV fluids for hydration.  Patient can follow-up with her doctor outpatient with prescriptions for symptomatic relief and Cipro Flagyl for diverticulitis..  Tolerating oral intake after receiving IV fluids and medicines here in the ED.      ____________________________________________   FINAL CLINICAL IMPRESSION(S) / ED DIAGNOSES    Final diagnoses:  Viral syndrome  Dehydration     ED Discharge Orders         Ordered    naproxen (NAPROSYN) 500 MG tablet  2 times daily with meals     12/22/18 1411    famotidine (PEPCID) 20 MG tablet  2 times  daily     12/22/18 1411    ondansetron (ZOFRAN ODT) 4 MG disintegrating tablet  Every 8 hours PRN     12/22/18 1411          Portions of this note were generated with dragon dictation software. Dictation errors may occur despite best attempts at proofreading.   Sharman CheekStafford, Abrianna Sidman, MD 12/22/18 1415    Sharman CheekStafford, Filippo Puls, MD 12/22/18 [phone]

## 2018-12-22 NOTE — ED Notes (Signed)
MD has spoken with pt.  New d/c paperwork given. Pt left ambulatory

## 2020-03-08 ENCOUNTER — Other Ambulatory Visit: Payer: Self-pay

## 2020-03-08 ENCOUNTER — Emergency Department: Payer: Self-pay

## 2020-03-08 ENCOUNTER — Encounter: Payer: Self-pay | Admitting: Emergency Medicine

## 2020-03-08 ENCOUNTER — Emergency Department
Admission: EM | Admit: 2020-03-08 | Discharge: 2020-03-08 | Disposition: A | Discharge status: Home / Self Care | Payer: Self-pay | Attending: Emergency Medicine | Admitting: Emergency Medicine

## 2020-03-08 DIAGNOSIS — Z79899 Other long term (current) drug therapy: Secondary | ICD-10-CM | POA: Insufficient documentation

## 2020-03-08 DIAGNOSIS — K5792 Diverticulitis of intestine, part unspecified, without perforation or abscess without bleeding: Secondary | ICD-10-CM | POA: Insufficient documentation

## 2020-03-08 DIAGNOSIS — I10 Essential (primary) hypertension: Secondary | ICD-10-CM | POA: Insufficient documentation

## 2020-03-08 LAB — CBC WITH DIFFERENTIAL/PLATELET
Abs Immature Granulocytes: 0.03 10*3/uL (ref 0.00–0.07)
Basophils Absolute: 0.1 10*3/uL (ref 0.0–0.1)
Basophils Relative: 1 %
Eosinophils Absolute: 0.2 10*3/uL (ref 0.0–0.5)
Eosinophils Relative: 1 %
HCT: 36.4 % (ref 36.0–46.0)
Hemoglobin: 12 g/dL (ref 12.0–15.0)
Immature Granulocytes: 0 %
Lymphocytes Relative: 2 %
Lymphs Abs: 0.2 10*3/uL — ABNORMAL LOW (ref 0.7–4.0)
MCH: 28 pg (ref 26.0–34.0)
MCHC: 33 g/dL (ref 30.0–36.0)
MCV: 84.8 fL (ref 80.0–100.0)
Monocytes Absolute: 0.4 10*3/uL (ref 0.1–1.0)
Monocytes Relative: 3 %
Neutro Abs: 10.7 10*3/uL — ABNORMAL HIGH (ref 1.7–7.7)
Neutrophils Relative %: 93 %
Platelets: 177 10*3/uL (ref 150–400)
RBC: 4.29 MIL/uL (ref 3.87–5.11)
RDW: 13.6 % (ref 11.5–15.5)
Smear Review: NORMAL
WBC: 11.6 10*3/uL — ABNORMAL HIGH (ref 4.0–10.5)
nRBC: 0 % (ref 0.0–0.2)

## 2020-03-08 LAB — COMPREHENSIVE METABOLIC PANEL
ALT: 33 U/L (ref 0–44)
AST: 27 U/L (ref 15–41)
Albumin: 3.2 g/dL — ABNORMAL LOW (ref 3.5–5.0)
Alkaline Phosphatase: 74 U/L (ref 38–126)
Anion gap: 11 (ref 5–15)
BUN: 14 mg/dL (ref 6–20)
CO2: 23 mmol/L (ref 22–32)
Calcium: 8.6 mg/dL — ABNORMAL LOW (ref 8.9–10.3)
Chloride: 97 mmol/L — ABNORMAL LOW (ref 98–111)
Creatinine, Ser: 1.4 mg/dL — ABNORMAL HIGH (ref 0.44–1.00)
GFR calc Af Amer: 54 mL/min — ABNORMAL LOW (ref >60)
GFR calc non Af Amer: 47 mL/min — ABNORMAL LOW (ref >60)
Glucose, Bld: 113 mg/dL — ABNORMAL HIGH (ref 70–99)
Potassium: 3.7 mmol/L (ref 3.5–5.1)
Sodium: 131 mmol/L — ABNORMAL LOW (ref 135–145)
Total Bilirubin: 1.3 mg/dL — ABNORMAL HIGH (ref 0.3–1.2)
Total Protein: 6.8 g/dL (ref 6.5–8.1)

## 2020-03-08 LAB — LACTIC ACID, PLASMA: Lactic Acid, Venous: 1.9 mmol/L (ref 0.5–1.9)

## 2020-03-08 LAB — URINALYSIS, COMPLETE (UACMP) WITH MICROSCOPIC
Bacteria, UA: NONE SEEN
Bilirubin Urine: NEGATIVE
Glucose, UA: NEGATIVE mg/dL
Ketones, ur: NEGATIVE mg/dL
Nitrite: NEGATIVE
Protein, ur: 100 mg/dL — AB
Specific Gravity, Urine: 1.008 (ref 1.005–1.030)
Squamous Epithelial / HPF: NONE SEEN (ref 0–5)
pH: 8 (ref 5.0–8.0)

## 2020-03-08 LAB — POCT PREGNANCY, URINE: Preg Test, Ur: NEGATIVE

## 2020-03-08 LAB — LIPASE, BLOOD: Lipase: 17 U/L (ref 11–51)

## 2020-03-08 MED ORDER — DICYCLOMINE HCL 10 MG PO CAPS: 10.0000 mg | ORAL_CAPSULE | Freq: Three times a day (TID) | ORAL | 0 refills | Status: AC | PRN

## 2020-03-08 MED ORDER — ONDANSETRON HCL 4 MG/2ML IJ SOLN
4.0000 mg | Freq: Once | INTRAMUSCULAR | Status: AC
  Administered 2020-03-08: 4 mg via INTRAVENOUS
  Filled 2020-03-08: qty 2

## 2020-03-08 MED ORDER — MORPHINE SULFATE (PF) 4 MG/ML IV SOLN
4.0000 mg | Freq: Once | INTRAVENOUS | Status: AC
  Administered 2020-03-08: 4 mg via INTRAVENOUS
  Filled 2020-03-08: qty 1

## 2020-03-08 MED ORDER — ONDANSETRON HCL 4 MG PO TABS: 4.0000 mg | ORAL_TABLET | Freq: Three times a day (TID) | ORAL | 0 refills | Status: AC | PRN

## 2020-03-08 MED ORDER — SODIUM CHLORIDE 0.9 % IV BOLUS
1000.0000 mL | Freq: Once | INTRAVENOUS | Status: AC
  Administered 2020-03-08: 1000 mL via INTRAVENOUS

## 2020-03-08 MED ORDER — HYDROCODONE-ACETAMINOPHEN 5-325 MG PO TABS: 1.0000 | ORAL_TABLET | Freq: Four times a day (QID) | ORAL | 0 refills | Status: AC | PRN

## 2020-03-08 MED ORDER — AMOXICILLIN-POT CLAVULANATE ER 1000-62.5 MG PO TB12: 1.0000 | ORAL_TABLET | Freq: Two times a day (BID) | ORAL | 0 refills | Status: AC

## 2020-03-08 MED ORDER — AMOXICILLIN-POT CLAVULANATE 875-125 MG PO TABS
1.0000 | ORAL_TABLET | Freq: Once | ORAL | Status: AC
  Administered 2020-03-08: 1 via ORAL
  Filled 2020-03-08: qty 1

## 2020-03-08 MED ORDER — ACETAMINOPHEN 325 MG PO TABS
650.0000 mg | ORAL_TABLET | Freq: Once | ORAL | Status: AC | PRN
  Administered 2020-03-08: 650 mg via ORAL
  Filled 2020-03-08: qty 2

## 2020-03-08 MED ORDER — DICYCLOMINE HCL 10 MG PO CAPS
20.0000 mg | ORAL_CAPSULE | Freq: Once | ORAL | Status: AC
  Administered 2020-03-08: 20 mg via ORAL
  Filled 2020-03-08: qty 2

## 2020-03-08 NOTE — Discharge Instructions (Addendum)
Please seek medical attention for any high fevers, chest pain, shortness of breath, change in behavior, persistent vomiting, bloody stool or any other new or concerning symptoms.  

## 2020-03-08 NOTE — ED Provider Notes (Signed)
[facility] Emergency Department Provider Note  ____________________________________________   I have reviewed the triage vital signs and the nursing notes.   HISTORY  Chief Complaint Abdominal Pain and Emesis   History limited by: Not Limited   HPI Michelle Holder is a 39 y.o. female who presents to the emergency department today because of concerns for left sided abdominal pain, nausea vomiting and fevers.  Patient states that his symptoms have been present for the past 3 days.  She feels like it is her diverticulitis.  She thinks it might of flared up because of poor sleeping condition she has had over the past few days as she has been working with a client.  Patient states this feels like her previous episodes of diverticulitis.  She has only had to be hospitalized once and is typically able to be treated with oral antibiotics.  Patient denies any unusual ingestions.    Records reviewed. Per medical record review patient has a history of HTN, diverticulitis.  Past Medical History:  Diagnosis Date  . AMA (advanced maternal age) multigravida 35+   . Bariatric surgery status   . Dysmenorrhea   . Hypertension   . Knee pain    right  . Obesity   . S/P cesarean section 12/03/2016  . Thyroid disease    thyroid nodule  . Thyroid nodule   . Vitamin D deficiency     Patient Active Problem List   Diagnosis Date Noted  . S/P cesarean section 12/03/2016  . Pregnancy induced hypertension, third trimester 11/27/2016  . Hypertension affecting pregnancy in third trimester 11/23/2016  . Chronic hypertension with superimposed pre-eclampsia 11/20/2016  . Pre-existing essential htn comp pregnancy, third trimester 11/20/2016  . Diverticulitis of intestine with abscess 04/08/2016  . Diverticulitis of large intestine with abscess without bleeding   . Dysmenorrhea 07/07/2015  . History of iron deficiency 07/07/2015  . Thyroid nodule 07/07/2015  . Vitamin  D deficiency 07/07/2015  . Adult BMI 30+ 03/23/2010  . Benign essential HTN 02/20/2010    Past Surgical History:  Procedure Laterality Date  . CESAREAN SECTION    . CESAREAN SECTION N/A 12/03/2016   Procedure: CESAREAN SECTION;  Surgeon: Sherian Rein, MD;  Location: [facility];  Service: Obstetrics;  Laterality: N/A;  . LAPAROSCOPIC GASTRECTOMY     SILS    Prior to Admission medications   Medication Sig Start Date End Date Taking? Authorizing Provider  famotidine (PEPCID) 20 MG tablet Take 1 tablet (20 mg total) by mouth 2 (two) times daily. 12/22/18   Sharman Cheek, MD  Multiple Vitamin (MULTIVITAMIN) tablet Take 1 tablet by mouth daily.    [provider]  ondansetron (ZOFRAN ODT) 4 MG disintegrating tablet Take 1 tablet (4 mg total) by mouth every 8 (eight) hours as needed for nausea or vomiting. 12/22/18   Sharman Cheek, MD    Allergies Patient has no known allergies.  Family History  Problem Relation Age of Onset  . Down syndrome Brother     Social History Social History   Tobacco Use  . Smoking status: Never Smoker  . Smokeless tobacco: Never Used  Substance Use Topics  . Alcohol use: No    Alcohol/week: 0.0 standard drinks  . Drug use: No    Review of Systems Constitutional: Positive for fever. Eyes: No visual changes. ENT: No sore throat. Cardiovascular: Denies chest pain. Respiratory: Denies shortness of breath. Gastrointestinal: Positive for abdominal pain, nausea and vomiting. Genitourinary: Negative for dysuria. Musculoskeletal:  Negative for back pain. Skin: Negative for rash. Neurological: Negative for headaches, focal weakness or numbness.  ____________________________________________   PHYSICAL EXAM:  VITAL SIGNS: ED Triage Vitals  Enc Vitals Group     BP 03/08/20 1354 129/85     Pulse Rate 03/08/20 1354 (!) 125     Resp 03/08/20 1354 20     Temp 03/08/20 1354 (!) 103 F (39.4 C)     Temp Source 03/08/20 1354  Oral     SpO2 03/08/20 1354 100 %     Weight 03/08/20 1355 170 lb (77.1 kg)     Height 03/08/20 1355 5\' 8"  (1.727 m)     Head Circumference --      Peak Flow --      Pain Score 03/08/20 1354 7   Constitutional: Alert and oriented.  Eyes: Conjunctivae are normal.  ENT      Head: Normocephalic and atraumatic.      Nose: No congestion/rhinnorhea.      Mouth/Throat: Mucous membranes are moist.      Neck: No stridor. Hematological/Lymphatic/Immunilogical: No cervical lymphadenopathy. Cardiovascular: Tachycardic, regular rhythm.  No murmurs, rubs, or gallops.  Respiratory: Normal respiratory effort without tachypnea nor retractions. Breath sounds are clear and equal bilaterally. No wheezes/rales/rhonchi. Gastrointestinal: Soft and tender to palpation in the left lower quadrant. Genitourinary: Deferred Musculoskeletal: Normal range of motion in all extremities. No lower extremity edema. Neurologic:  Normal speech and language. No gross focal neurologic deficits are appreciated.  Skin:  Skin is warm, dry and intact. No rash noted. Psychiatric: Mood and affect are normal. Speech and behavior are normal. Patient exhibits appropriate insight and judgment.  ____________________________________________    LABS (pertinent positives/negatives)  CBC wbc 11.6, hgb 12.0, plt 177 Upreg negative CMP na 131, k 3.7, cr 1.40 UA clear, moderate hgb dipstick, protein 100, trace leukocytes, 11-20 rbc, 0-5 wbc Lactic acid 1.9 ____________________________________________   EKG  I, Nance Pear, attending physician, personally viewed and interpreted this EKG  EKG Time: 1356 Rate: 117 Rhythm: sinus tachycardia Axis: normal Intervals: qtc 426 QRS: narrow, q waves v1, v2 ST changes: no st elevation Impression: abnormal ekg ____________________________________________    RADIOLOGY  CXR No acute  abnormality  ____________________________________________   PROCEDURES  Procedures  ____________________________________________   INITIAL IMPRESSION / ASSESSMENT AND PLAN / ED COURSE  Pertinent labs & imaging results that were available during my care of the patient were reviewed by me and considered in my medical decision making (see chart for details).   Patient presented to the emergency department today because of concerns for left sided abdominal pain.  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  Patient's blood work does show a mild leukocytosis and she did have a fever consistent with infection.  I did discussion with the patient about CT imaging.  At this point patient felt comfortable deferring given that the pain felt similar to her previous episodes of diverticulitis.  She did not feel like this pain was different or worse.  She was given pain medication and a dose of antibiotics here in the emergency department.  She did feel improvement.  Will plan on discharging with further medications to help with the symptoms antibiotics.  Did discuss with patient strict return precautions.  Also advised primary care follow-up.  ____________________________________________   FINAL CLINICAL IMPRESSION(S) / ED DIAGNOSES  Final diagnoses:  Diverticulitis     Note: This dictation was prepared with Viviann Spare  dictation. Any transcriptional errors that result from this process are unintentional     Phineas Semen, MD 03/08/20 2056

## 2020-03-08 NOTE — ED Triage Notes (Signed)
Patient presents to the ED with upper left quadrant abdominal pain radiating into left mid-back.  Patient reports nausea and vomiting and history of diverticulitis.  Patient states, "I know this is my diverticulitis acting up."  Patient is dry heaving in triage and is febrile.  Patient appears very uncomfortable.

## 2022-01-29 ENCOUNTER — Emergency Department
Admission: EM | Admit: 2022-01-29 | Discharge: 2022-01-29 | Disposition: A | Discharge status: Home / Self Care | Payer: Self-pay | Attending: Emergency Medicine | Admitting: Emergency Medicine

## 2022-01-29 ENCOUNTER — Emergency Department: Payer: Self-pay

## 2022-01-29 ENCOUNTER — Other Ambulatory Visit: Payer: Self-pay

## 2022-01-29 DIAGNOSIS — I10 Essential (primary) hypertension: Secondary | ICD-10-CM | POA: Insufficient documentation

## 2022-01-29 DIAGNOSIS — M25511 Pain in right shoulder: Secondary | ICD-10-CM | POA: Insufficient documentation

## 2022-01-29 MED ORDER — HYDROCODONE-ACETAMINOPHEN 5-325 MG PO TABS: 1.0000 | ORAL_TABLET | ORAL | 0 refills | Status: AC | PRN

## 2022-01-29 MED ORDER — HYDROCODONE-ACETAMINOPHEN 5-325 MG PO TABS
1.0000 | ORAL_TABLET | Freq: Once | ORAL | Status: AC
  Administered 2022-01-29: 1 via ORAL
  Filled 2022-01-29: qty 1

## 2022-01-29 NOTE — Discharge Instructions (Addendum)
-  Treat pain with Tylenol/ibuprofen as needed.  Use hydrocodone sparingly. ?-Follow-up with the orthopedist listed above, as discussed. ?-Return to the emergency department at any time if you begin to experience any new or worsening symptoms. ?

## 2022-01-29 NOTE — ED Provider Notes (Signed)
? ?[facility] ?Provider Note ? ? ? Event Date/Time  ? First MD Initiated Contact with Patient 01/29/22 2033   ?  (approximate) ? ? ?History  ? ?Chief Complaint ?Shoulder Pain ? ? ?HPI ?Michelle Holder Ketty Bitton is a 41 y.o. female, history of hypertension, diverticulitis, dysmenorrhea, thyroid nodule, iron deficiency, presents to the emergency department for evaluation of shoulder pain.  Patient states that the pain has been gradually worsening for the past 2 days.  Reports moderate physical activity/weight training recently, but otherwise no acute precipitating event.  Patient denies any clicking when moving her shoulder.  Denies fever/chills, neck pain, back pain, headache, numbness/tingling in upper extremities, chest pain, shortness of breath, or abdominal pain. ? ?History Limitations: No limitations. ? ?  ? ? ?Physical Exam  ?Triage Vital Signs: ?ED Triage Vitals [01/29/22 2031]  ?Enc Vitals Group  ?   BP (!) 153/109  ?   Pulse Rate 65  ?   Resp 14  ?   Temp 97.6 ?F (36.4 ?C)  ?   Temp Source Oral  ?   SpO2 99 %  ?   Weight 175 lb (79.4 kg)  ?   Height 5\' 9"  (1.753 m)  ?   Head Circumference   ?   Peak Flow   ?   Pain Score 8  ?   Pain Loc   ?   Pain Edu?   ?   Excl. in GC?   ? ? ?Most recent vital signs: ?Vitals:  ? 01/29/22 2031  ?BP: (!) 153/109  ?Pulse: 65  ?Resp: 14  ?Temp: 97.6 ?F (36.4 ?C)  ?SpO2: 99%  ? ? ?General: Awake, NAD.  ?CV: Good peripheral perfusion.  ?Resp: Normal effort.  ?Abd: Soft, non-tender. No distention.  ?Neuro: At baseline. No gross neurological deficits.  ?Other: No gross deformities appreciated in the right upper extremity.  No warmth, erythema, or tenderness to the shoulder joint.  Patient is guarding heavily and resists abduction of the arm, though passive range of motion remains intact.  Pulse, motor, sensation intact distally.  No tenderness along the biceps tendon.  Positive empty can test. ? ?Physical Exam ? ? ? ?ED Results / Procedures / Treatments   ?Labs ?(all labs ordered are listed, but only abnormal results are displayed) ?Labs Reviewed - No data to display ? ? ?EKG ?Not applicable. ? ? ?RADIOLOGY ? ?ED Provider Interpretation: I personally reviewed and interpreted this x-ray, no evidence of fracture or dislocation. ? ?DG Shoulder Right ? ?Result Date: 01/29/2022 ?CLINICAL DATA:  Right shoulder pain EXAM: RIGHT SHOULDER - 2+ VIEW COMPARISON:  03/14/2014 FINDINGS: There is no evidence of fracture or dislocation. There is no evidence of arthropathy or other focal bone abnormality. Soft tissues are unremarkable. IMPRESSION: Negative. Electronically Signed   By: 03/16/2014 M.D.   On: 01/29/2022 21:11   ? ?PROCEDURES: ? ?Critical Care performed: None. ? ?Procedures ? ? ? ?MEDICATIONS ORDERED IN ED: ?Medications  ?HYDROcodone-acetaminophen (NORCO/VICODIN) 5-325 MG per tablet 1 tablet (1 tablet Oral Given 01/29/22 2144)  ? ? ? ?IMPRESSION / MDM / ASSESSMENT AND PLAN / ED COURSE  ?I reviewed the triage vital signs and the nursing notes. ?             ?               ? ?Differential diagnosis includes, but is not limited to, rotator cuff injury, shoulder labrum tear, biceps tendinitis, impingement syndrome, adhesive capsulitis, AC  joint injury ? ? ?ED Course ?Patient appears uncomfortable, but stable.  Vital signs within normal limits.  We will go ahead treat with hydrocodone/acetaminophen. ? ?X-ray shows no evidence of acute fracture or dislocation. ? ?Assessment/Plan ?Presentation consistent with rotator cuff tear versus shoulder sprain.  No evidence of infection.  X-ray reassuring for no fracture or dislocation.  Will provide patient with a sling and provide referral to orthopedics for further evaluation.  Will provide patient with a short course of hydrocodone/acetaminophen to be used as needed if ibuprofen fails. ? ?Patient was provided with anticipatory guidance, return precautions, and educational material. Encouraged the patient to return to the emergency  department at any time if they begin to experience any new or worsening symptoms.  ? ?  ? ? ?FINAL CLINICAL IMPRESSION(S) / ED DIAGNOSES  ? ?Final diagnoses:  ?Acute pain of right shoulder  ? ? ? ?Rx / DC Orders  ? ?ED Discharge Orders   ? ?      Ordered  ?  HYDROcodone-acetaminophen (NORCO/VICODIN) 5-325 MG tablet  Every 4 hours PRN       ? 01/29/22 2142  ? ?  ?  ? ?  ? ? ? ?Note:  This document was prepared using Dragon voice recognition software and may include unintentional dictation errors. ?  ?Varney Daily, [place] ?01/29/22 2155 ? ?  Sharyn Creamer, MD ?01/29/22 2352 ? ?

## 2022-01-29 NOTE — ED Triage Notes (Signed)
Pt presents via POV c/o right shoulder pain since Saturday. Reports was moving and pain began. Denies specific injury. Reports worsening pain with movement.  ?
